# Patient Record
Sex: Male | Born: 1955 | Race: White | Hispanic: No | Marital: Married | Smoking: Never smoker
Health system: Southern US, Community
[De-identification: ages and names within clinical notes are randomized; demographics above are authoritative.]

## PROBLEM LIST (undated history)

## (undated) DIAGNOSIS — G4733 Obstructive sleep apnea (adult) (pediatric): Secondary | ICD-10-CM

## (undated) DIAGNOSIS — E119 Type 2 diabetes mellitus without complications: Secondary | ICD-10-CM

## (undated) DIAGNOSIS — E7800 Pure hypercholesterolemia, unspecified: Secondary | ICD-10-CM

## (undated) DIAGNOSIS — I509 Heart failure, unspecified: Secondary | ICD-10-CM

## (undated) HISTORY — DX: Obstructive sleep apnea (adult) (pediatric): G47.33

## (undated) HISTORY — PX: SINUS SURGERY WITH INSTATRAK: SHX5215

---

## 2017-06-15 ENCOUNTER — Emergency Department (HOSPITAL_COMMUNITY)
Admission: EM | Admit: 2017-06-15 | Discharge: 2017-06-16 | Disposition: A | Discharge status: Home / Self Care | Payer: Commercial Managed Care - PPO | Attending: Emergency Medicine | Admitting: Emergency Medicine

## 2017-06-15 ENCOUNTER — Encounter (HOSPITAL_COMMUNITY): Payer: Self-pay | Admitting: Emergency Medicine

## 2017-06-15 DIAGNOSIS — E119 Type 2 diabetes mellitus without complications: Secondary | ICD-10-CM | POA: Diagnosis not present

## 2017-06-15 DIAGNOSIS — R42 Dizziness and giddiness: Secondary | ICD-10-CM | POA: Diagnosis not present

## 2017-06-15 HISTORY — DX: Pure hypercholesterolemia, unspecified: E78.00

## 2017-06-15 HISTORY — DX: Type 2 diabetes mellitus without complications: E11.9

## 2017-06-15 LAB — COMPREHENSIVE METABOLIC PANEL
ALT: 23 U/L (ref 17–63)
AST: 28 U/L (ref 15–41)
Albumin: 4.1 g/dL (ref 3.5–5.0)
Alkaline Phosphatase: 51 U/L (ref 38–126)
Anion gap: 10 (ref 5–15)
BUN: 12 mg/dL (ref 6–20)
CO2: 24 mmol/L (ref 22–32)
Calcium: 8.5 mg/dL — ABNORMAL LOW (ref 8.9–10.3)
Chloride: 106 mmol/L (ref 101–111)
Creatinine, Ser: 0.89 mg/dL (ref 0.61–1.24)
GFR calc Af Amer: >60 mL/min (ref >60)
GFR calc non Af Amer: >60 mL/min (ref >60)
Glucose, Bld: 220 mg/dL — ABNORMAL HIGH (ref 65–99)
Potassium: 3.7 mmol/L (ref 3.5–5.1)
Sodium: 140 mmol/L (ref 135–145)
Total Bilirubin: 0.4 mg/dL (ref 0.3–1.2)
Total Protein: 7.1 g/dL (ref 6.5–8.1)

## 2017-06-15 LAB — CBC
HCT: 42.6 % (ref 39.0–52.0)
Hemoglobin: 14.3 g/dL (ref 13.0–17.0)
MCH: 29.8 pg (ref 26.0–34.0)
MCHC: 33.6 g/dL (ref 30.0–36.0)
MCV: 88.8 fL (ref 78.0–100.0)
Platelets: 194 10*3/uL (ref 150–400)
RBC: 4.8 MIL/uL (ref 4.22–5.81)
RDW: 13.5 % (ref 11.5–15.5)
WBC: 13.3 10*3/uL — ABNORMAL HIGH (ref 4.0–10.5)

## 2017-06-15 LAB — URINALYSIS, ROUTINE W REFLEX MICROSCOPIC
Bilirubin Urine: NEGATIVE
Glucose, UA: 150 mg/dL — AB
Hgb urine dipstick: NEGATIVE
Ketones, ur: NEGATIVE mg/dL
Leukocytes, UA: NEGATIVE
Nitrite: NEGATIVE
Protein, ur: NEGATIVE mg/dL
Specific Gravity, Urine: 1.024 (ref 1.005–1.030)
pH: 7 (ref 5.0–8.0)

## 2017-06-15 LAB — LIPASE, BLOOD: Lipase: 21 U/L (ref 11–51)

## 2017-06-15 LAB — CBG MONITORING, ED: Glucose-Capillary: 157 mg/dL — ABNORMAL HIGH (ref 65–99)

## 2017-06-15 MED ORDER — ONDANSETRON 4 MG PO TBDP
4.0000 mg | ORAL_TABLET | Freq: Once | ORAL | Status: AC | PRN
  Administered 2017-06-15: 4 mg via ORAL

## 2017-06-15 MED ORDER — MECLIZINE HCL 25 MG PO TABS: 25.0000 mg | ORAL_TABLET | Freq: Three times a day (TID) | ORAL | 0 refills | Status: DC | PRN

## 2017-06-15 MED ORDER — MECLIZINE HCL 25 MG PO TABS
50.0000 mg | ORAL_TABLET | Freq: Once | ORAL | Status: AC
  Administered 2017-06-15: 50 mg via ORAL
  Filled 2017-06-15: qty 2

## 2017-06-15 MED ORDER — ONDANSETRON 4 MG PO TBDP
4.0000 mg | ORAL_TABLET | Freq: Once | ORAL | Status: AC
  Administered 2017-06-15: 4 mg via ORAL
  Filled 2017-06-15: qty 1

## 2017-06-15 MED ORDER — AZITHROMYCIN 250 MG PO TABS
250.0000 mg | ORAL_TABLET | Freq: Every day | ORAL | 0 refills | Status: DC
Start: 2017-06-15 — End: 2018-07-09

## 2017-06-15 NOTE — ED Provider Notes (Signed)
[facility] EMERGENCY DEPARTMENT Provider Note   CSN: [id number] Arrival date & time: 06/15/17  1641     History   Chief Complaint Chief Complaint  Patient presents with  . Dizziness  . Nausea  . Emesis    HPI Marcus Vargas is a 61 y.o. male with history of diabetes and high cholesterol presents to ED for evaluation of sudden onset dizziness described as "room spinning" that began this morning when he sat up in bed. Associated symptoms include nausea and nonbilious, non-bloody emesis. Reports recent nasal congestion, green, thick nasal drainage worse on the right associated with mild sinus/frontal headache. Reports long history of sinus problems with previous sinus surgeries. Has been using bulb syringe to drain nose over the last couple days. Denies fevers, chills, visual changes, tinnitus, chest pain, shortness of breath, abdominal pain, numbness or weakness to extremities. No previous history of CVA/TIA/vertigo.  HPI  Past Medical History:  Diagnosis Date  . Diabetes mellitus without complication (HCC)   . High cholesterol     There are no active problems to display for this patient.   Past Surgical History:  Procedure Laterality Date  . SINUS SURGERY WITH INSTATRAK         Home Medications    Prior to Admission medications   Medication Sig Start Date End Date Taking? Authorizing Provider  azithromycin (ZITHROMAX) 250 MG tablet Take 1 tablet (250 mg total) by mouth daily. Take first 2 tablets together, then 1 every day until finished. 06/15/17   Liberty Handy, PA-C  meclizine (ANTIVERT) 25 MG tablet Take 1 tablet (25 mg total) by mouth 3 (three) times daily as needed for dizziness. 06/15/17   Liberty Handy, PA-C    Family History No family history on file.  Social History Social History  Substance Use Topics  . Smoking status: Never Smoker  . Smokeless tobacco: Never Used  . Alcohol use No     Allergies   Patient has no known  allergies.   Review of Systems Review of Systems  Constitutional: Negative for chills and fever.  HENT: Positive for postnasal drip, rhinorrhea, sinus pain and sinus pressure. Negative for ear discharge.   Eyes: Negative for visual disturbance.  Respiratory: Negative for shortness of breath.   Cardiovascular: Negative for chest pain and palpitations.  Gastrointestinal: Positive for nausea and vomiting. Negative for abdominal pain, constipation and diarrhea.  Genitourinary: Negative for difficulty urinating and dysuria.  Musculoskeletal: Negative for neck pain and neck stiffness.  Skin: Negative for rash.  Neurological: Positive for dizziness and headaches. Negative for syncope, weakness, light-headedness and numbness.     Physical Exam Updated Vital Signs BP (!) 146/90   Pulse (!) 106   Temp 97.7 F (36.5 C) (Oral)   Resp 17   SpO2 94%   Physical Exam  Constitutional: He is oriented to person, place, and time. He appears well-developed and well-nourished. No distress.  NAD.  HENT:  Head: Normocephalic and atraumatic.  Right Ear: External ear normal.  Left Ear: External ear normal.  Nose: Nose normal.  TMs normal bilateral Erythematous mucosal edema, dried up blood in septum bilaterally No facial or sinus tenderness  Eyes: Pupils are equal, round, and reactive to light. Conjunctivae are normal. No scleral icterus. Right eye exhibits nystagmus. Left eye exhibits nystagmus.  PERRL bilaterally Left horizontal nystagmus   Neck: Normal range of motion. Neck supple.  Cardiovascular: Normal rate, regular rhythm, normal heart sounds and intact distal pulses.   No  murmur heard. Pulmonary/Chest: Effort normal and breath sounds normal. He has no wheezes.  Abdominal: Soft. Bowel sounds are normal. There is no tenderness.  Musculoskeletal: Normal range of motion. He exhibits no deformity.  Neurological: He is alert and oriented to person, place, and time.  Speech and phonation  normal.  Strength 5/5 with hand grip and ankle flexion/extension.   Sensation to light touch intact in hands and feet. Gait normal.   Negative Romberg. No leg drift.  Intact finger to nose test. CN I not tested CN II full visual fields bilaterally CN III, IV, VI PEERL and EOMs intact bilaterally CN V light touch intact in all 3 divisions of trigeminal nerve CN VII facial nerve movements intact, symmetric, bilaterally CN VIII hearing intact to finger rub, bilaterally CN IX, X no uvula deviation, symmetric soft palate rise CN XI 5/5 SCM and trapezius strength bilaterally  CN XII Tongue midline with symmetric L/R movement  Skin: Skin is warm and dry. Capillary refill takes less than 2 seconds.  Psychiatric: He has a normal mood and affect. His behavior is normal. Judgment and thought content normal.  Nursing note and vitals reviewed.    ED Treatments / Results  Labs (all labs ordered are listed, but only abnormal results are displayed) Labs Reviewed  COMPREHENSIVE METABOLIC PANEL - Abnormal; Notable for the following:       Result Value   Glucose, Bld 220 (*)    Calcium 8.5 (*)    All other components within normal limits  CBC - Abnormal; Notable for the following:    WBC 13.3 (*)    All other components within normal limits  URINALYSIS, ROUTINE W REFLEX MICROSCOPIC - Abnormal; Notable for the following:    APPearance HAZY (*)    Glucose, UA 150 (*)    All other components within normal limits  CBG MONITORING, ED - Abnormal; Notable for the following:    Glucose-Capillary 157 (*)    All other components within normal limits  LIPASE, BLOOD    EKG  EKG Interpretation None       Radiology No results found.  Procedures Procedures (including critical care time)  Medications Ordered in ED Medications  ondansetron (ZOFRAN-ODT) disintegrating tablet 4 mg (4 mg Oral Given 06/15/17 1942)  meclizine (ANTIVERT) tablet 50 mg (50 mg Oral Given 06/15/17 2144)  ondansetron  (ZOFRAN-ODT) disintegrating tablet 4 mg (4 mg Oral Given 06/15/17 2142)     Initial Impression / Assessment and Plan / ED Course  I have reviewed the triage vital signs and the nursing notes.  Pertinent labs & imaging results that were available during my care of the patient were reviewed by me and considered in my medical decision making (see chart for details).  Clinical Course as of Jun 15 2304  Sat Jun 15, 2017  2118 WBC: (!) 13.3 [CG]    Clinical Course User Index [CG] Liberty Handy, PA-C   61 y.o. yo male presents with intermittent episodes of vertiginous type dizziness. No red flag symptoms including trauma, focal sensory deficits, headache, LOC, shortness of breath, chest pain, palpitations.  No known B12 deficiencies or thyroid conditions.  No new medications or medication changes.  Medications reviewed, none have adverse effects that could be contributing. Reports recent sinus congestion, has been doing bulb syringe rinses.  On exam, neuro intact, HiNTS with horizontal nystagmus otherwise normal. No diplopia, dysarthria, dysphagia, dysmetria, weakness or sensory loss to extremities.  I personally stood patient who was able to stand  w/o assist. I did not appreciate any tremors or shakiness while walking. ENT exam normal.  VS reassuring.  No hyponatremia or other electrolyte abnormalities. EKG without arrythmias/ischemia.  Final Clinical Impressions(s) / ED Diagnoses   Pt given meclizine and zofran. Re-evaluated pt and he reports significant improvement. Patient is considered safe to discharge at this time.  Doubt central cause of symptoms including stroke or TIA.  hx and exam most c/w vertigo. Will discharge with close PCP f/u, strict ED return precautions.  Patient aware of red flag symptoms to monitor for that would warrant return to ED.   Final diagnoses:  Dizziness    New Prescriptions New Prescriptions   AZITHROMYCIN (ZITHROMAX) 250 MG TABLET    Take 1 tablet (250 mg  total) by mouth daily. Take first 2 tablets together, then 1 every day until finished.   MECLIZINE (ANTIVERT) 25 MG TABLET    Take 1 tablet (25 mg total) by mouth 3 (three) times daily as needed for dizziness.     Liberty HandyGibbons, Claudia J, PA-C 06/15/17 2305    Donnetta Hutchingook, Brian, MD 06/15/17 [phone]

## 2017-06-15 NOTE — Discharge Instructions (Signed)
You presented to ED for dizziness, nausea, vomiting. Your lab work and EKG were normal. Your symptoms improved after vertigo and nausea medicine.   We suspect this may be vertigo from a sinus infection.   Take azithromycin for sinusitis. Use meclizine for vertigo three times daily for the next 3-5 days.   Return to ED if you develop worsening dizziness, sudden headache, changes in vision, numbness or weakness to one side of the body, slurred speech

## 2017-06-15 NOTE — ED Notes (Signed)
Message received that patient is having nausea.  Medicated per protocol order.

## 2017-06-15 NOTE — ED Triage Notes (Signed)
Pt sent from [facility] with c/o cold sweats, dizziness, nausea and vomiting onset today. Pt had similar episode 2 weeks ago. [facility].

## 2018-07-09 ENCOUNTER — Ambulatory Visit: Payer: Commercial Managed Care - PPO | Admitting: Cardiovascular Disease

## 2018-07-09 ENCOUNTER — Encounter: Payer: Self-pay | Admitting: Cardiovascular Disease

## 2018-07-09 VITALS — BP 142/86 | HR 91 | Ht 68.0 in | Wt 168.0 lb

## 2018-07-09 DIAGNOSIS — R0989 Other specified symptoms and signs involving the circulatory and respiratory systems: Secondary | ICD-10-CM

## 2018-07-09 DIAGNOSIS — E785 Hyperlipidemia, unspecified: Secondary | ICD-10-CM | POA: Insufficient documentation

## 2018-07-09 DIAGNOSIS — R9431 Abnormal electrocardiogram [ECG] [EKG]: Secondary | ICD-10-CM

## 2018-07-09 DIAGNOSIS — I10 Essential (primary) hypertension: Secondary | ICD-10-CM | POA: Insufficient documentation

## 2018-07-09 NOTE — Assessment & Plan Note (Signed)
EKG shows lateral T wave inversion consistent with either ischemia or left ventricular hypertrophy.

## 2018-07-09 NOTE — Progress Notes (Signed)
07/09/2018 [name]   Dec 20, 1955  [id number]  Primary Physician Daisy Floro, MD Primary Cardiologist: Runell Gess MD Nicholes Calamity, [place]  HPI:  Marcus Vargas is a 62 y.o. thin appearing married Caucasian male father of 4 children, grandfather one grandchild referred by Dr. Tenny Craw for cardiovascular evaluation because of elevated proBNP.  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  His cardiac risk factors are notable for true hyperlipidemia and diabetes.  There is no family history of heart disease.  Never had a heart attack or stroke.  He denies chest pain or shortness of breath.  He does get diaphoretic at times when he is exercising.  Apparently for insurance physical blood work revealed an elevated proBNP and he was referred here for further evaluation.   Current Meds  Medication Sig  . Azelastine HCl 0.15 % SOLN   . Choline Fenofibrate (FENOFIBRIC ACID) 135 MG CPDR   . glimepiride (AMARYL) 1 MG tablet   . levocetirizine (XYZAL) 5 MG tablet   . metFORMIN (GLUCOPHAGE-XR) 500 MG 24 hr tablet   . mometasone (NASONEX) 50 MCG/ACT nasal spray Place into the nose.  . simvastatin (ZOCOR) 20 MG tablet Take 20 mg by mouth.      No Known Allergies  Social History   Socioeconomic History  . Marital status: Married    Spouse name: Not on file  . Number of children: Not on file  . Years of education: Not on file  . Highest education level: Not on file  Occupational History  . Not on file  Social Needs  . Financial resource strain: Not on file  . Food insecurity:    Worry: Not on file    Inability: Not on file  . Transportation needs:    Medical: Not on file    Non-medical: Not on file  Tobacco Use  . Smoking status: Never Smoker  . Smokeless tobacco: Never Used  Substance and Sexual Activity  . Alcohol use: No  . Drug use: No  . Sexual activity: Not on file  Lifestyle  . Physical activity:    Days per week: Not on file    Minutes per session:  Not on file  . Stress: Not on file  Relationships  . Social connections:    Talks on phone: Not on file    Gets together: Not on file    Attends religious service: Not on file    Active member of club or organization: Not on file    Attends meetings of clubs or organizations: Not on file    Relationship status: Not on file  . Intimate partner violence:    Fear of current or ex partner: Not on file    Emotionally abused: Not on file    Physically abused: Not on file    Forced sexual activity: Not on file  Other Topics Concern  . Not on file  Social History Narrative  . Not on file     Review of Systems: General: negative for chills, fever, night sweats or weight changes.  Cardiovascular: negative for chest pain, dyspnea on exertion, edema, orthopnea, palpitations, paroxysmal nocturnal dyspnea or shortness of breath Dermatological: negative for rash Respiratory: negative for cough or wheezing Urologic: negative for hematuria Abdominal: negative for nausea, vomiting, diarrhea, bright red blood per rectum, melena, or hematemesis Neurologic: negative for visual changes, syncope, or dizziness All other systems reviewed and are otherwise negative except as noted above.    Blood  pressure (!) 142/86, pulse 91, height 5\' 8"  (1.727 m), weight 168 lb (76.2 kg).  General appearance: alert and no distress Neck: no adenopathy, no JVD, supple, symmetrical, trachea midline, thyroid not enlarged, symmetric, no tenderness/mass/nodules and Soft bilateral carotid bruits Lungs: clear to auscultation bilaterally Heart: regular rate and rhythm, S1, S2 normal, no murmur, click, rub or gallop Extremities: extremities normal, atraumatic, no cyanosis or edema Pulses: 2+ and symmetric Skin: Skin color, texture, turgor normal. No rashes or lesions Neurologic: Alert and oriented X 3, normal strength and tone. Normal symmetric reflexes. Normal coordination and gait  EKG sinus rhythm 88 with lateral T wave  inversion.  I personally reviewed this EKG.  ASSESSMENT AND PLAN:   Hyperlipidemia History of hyperlipidemia on statin therapy with recent lipid profile performed 01/31/2018 revealing total cholesterol of 146, LDL 67 and HDL of 62.  Abnormal EKG EKG shows lateral T wave inversion consistent with either ischemia or left ventricular hypertrophy.      Runell GessJonathan J. Berry MD FACP,FACC,FAHA, [facility] 07/09/2018 3:56 PM

## 2018-07-09 NOTE — Patient Instructions (Signed)
Medication Instructions:  Your physician recommends that you continue on your current medications as directed. Please refer to the Current Medication list given to you today.  If you need a refill on your cardiac medications before your next appointment, please call your pharmacy.   Lab work: Your physician recommends that you return for lab work in: TODAY - BNP  If you have labs (blood work) drawn today and your tests are completely normal, you will receive your results only by: Marland Kitchen. MyChart Message (if you have MyChart) OR . A paper copy in the mail If you have any lab test that is abnormal or we need to change your treatment, we will call you to review the results.  Testing/Procedures: Your provider has recommended that you have a CT Calcium Score to identify your risk for cardiovascular disease.   Your physician has requested that you have an echocardiogram. Echocardiography is a painless test that uses sound waves to create images of your heart. It provides your doctor with information about the size and shape of your heart and how well your heart's chambers and valves are working. This procedure takes approximately one hour. There are no restrictions for this procedure.  Your physician has requested that you have a carotid duplex. This test is an ultrasound of the carotid arteries in your neck. It looks at blood flow through these arteries that supply the brain with blood. Allow one hour for this exam. There are no restrictions or special instructions.    Follow-Up: At [facility], you and your health needs are our priority.  As part of our continuing mission to provide you with exceptional heart care, we have created designated Provider Care Teams.  These Care Teams include your primary Cardiologist (physician) and Advanced Practice Providers (APPs -  Physician Assistants and Nurse Practitioners) who all work together to provide you with the care you need, when you need it. . Follow up  with Dr. Allyson SabalBerry as needed. .   Any Other Special Instructions Will Be Listed Below (If Applicable).

## 2018-07-09 NOTE — Assessment & Plan Note (Signed)
History of hyperlipidemia on statin therapy with recent lipid profile performed 01/31/2018 revealing total cholesterol of 146, LDL 67 and HDL of 62.

## 2018-07-10 ENCOUNTER — Encounter: Payer: Self-pay | Admitting: Cardiovascular Disease

## 2018-07-10 LAB — BRAIN NATRIURETIC PEPTIDE: BNP: 78.4 pg/mL (ref 0.0–100.0)

## 2018-07-11 ENCOUNTER — Telehealth: Payer: Self-pay | Admitting: Adult Health

## 2018-07-11 NOTE — Telephone Encounter (Signed)
Called patient, gave normal results. Instructed patient how to do [place], sent email to his address to get into his mychart. Patient requested to have it released to there. If you can release them, I don't have the original lab to release. Thank you!

## 2018-07-11 NOTE — Telephone Encounter (Signed)
Follow Up: ° ° ° °Returning Debra's call from today, concerning his lab results. °

## 2018-07-22 ENCOUNTER — Ambulatory Visit (HOSPITAL_COMMUNITY)
Admission: RE | Admit: 2018-07-22 | Discharge: 2018-07-22 | Disposition: A | Discharge status: Home / Self Care | Payer: Commercial Managed Care - PPO | Source: Ambulatory Visit | Attending: Cardiovascular Disease | Admitting: Cardiovascular Disease

## 2018-07-22 DIAGNOSIS — R0989 Other specified symptoms and signs involving the circulatory and respiratory systems: Secondary | ICD-10-CM | POA: Insufficient documentation

## 2018-07-30 ENCOUNTER — Ambulatory Visit (HOSPITAL_COMMUNITY): Payer: Commercial Managed Care - PPO | Attending: Cardiology

## 2018-07-30 ENCOUNTER — Other Ambulatory Visit: Payer: Self-pay

## 2018-07-30 ENCOUNTER — Ambulatory Visit (INDEPENDENT_AMBULATORY_CARE_PROVIDER_SITE_OTHER)
Admission: RE | Admit: 2018-07-30 | Discharge: 2018-07-30 | Disposition: A | Discharge status: Home / Self Care | Payer: Self-pay | Source: Ambulatory Visit | Attending: Cardiovascular Disease | Admitting: Cardiovascular Disease

## 2018-07-30 DIAGNOSIS — R9431 Abnormal electrocardiogram [ECG] [EKG]: Secondary | ICD-10-CM

## 2018-08-04 ENCOUNTER — Other Ambulatory Visit: Payer: Self-pay | Admitting: *Deleted

## 2018-08-04 DIAGNOSIS — R0602 Shortness of breath: Secondary | ICD-10-CM

## 2018-08-04 DIAGNOSIS — R931 Abnormal findings on diagnostic imaging of heart and coronary circulation: Secondary | ICD-10-CM

## 2018-08-06 ENCOUNTER — Telehealth (HOSPITAL_COMMUNITY): Payer: Self-pay

## 2018-08-06 LAB — PRO B NATRIURETIC PEPTIDE: NT-Pro BNP: 400 pg/mL — ABNORMAL HIGH (ref 0–210)

## 2018-08-06 NOTE — Telephone Encounter (Signed)
Encounter complete. 

## 2018-08-07 ENCOUNTER — Ambulatory Visit (HOSPITAL_COMMUNITY)
Admission: RE | Admit: 2018-08-07 | Discharge: 2018-08-07 | Disposition: A | Discharge status: Home / Self Care | Payer: Commercial Managed Care - PPO | Source: Ambulatory Visit | Attending: Cardiology | Admitting: Cardiology

## 2018-08-07 DIAGNOSIS — R931 Abnormal findings on diagnostic imaging of heart and coronary circulation: Secondary | ICD-10-CM | POA: Insufficient documentation

## 2018-08-07 LAB — MYOCARDIAL PERFUSION IMAGING
LV dias vol: 222 mL (ref 62–150)
LV sys vol: 159 mL
Peak HR: 112 {beats}/min
Rest HR: 83 {beats}/min
SDS: 2
SRS: 3
SSS: 5
TID: 1.12

## 2018-08-07 MED ORDER — REGADENOSON 0.4 MG/5ML IV SOLN
0.4000 mg | Freq: Once | INTRAVENOUS | Status: AC
  Administered 2018-08-07: 0.4 mg via INTRAVENOUS

## 2018-08-07 MED ORDER — TECHNETIUM TC 99M TETROFOSMIN IV KIT
10.4000 | PACK | Freq: Once | INTRAVENOUS | Status: AC | PRN
  Administered 2018-08-07: 10.4 via INTRAVENOUS
  Filled 2018-08-07: qty 11

## 2018-08-07 MED ORDER — TECHNETIUM TC 99M TETROFOSMIN IV KIT
32.4000 | PACK | Freq: Once | INTRAVENOUS | Status: AC | PRN
  Administered 2018-08-07: 32.4 via INTRAVENOUS
  Filled 2018-08-07: qty 33

## 2018-08-08 ENCOUNTER — Encounter: Payer: Self-pay | Admitting: Cardiovascular Disease

## 2018-08-08 ENCOUNTER — Ambulatory Visit: Payer: Commercial Managed Care - PPO | Admitting: Cardiovascular Disease

## 2018-08-08 DIAGNOSIS — I519 Heart disease, unspecified: Secondary | ICD-10-CM

## 2018-08-08 NOTE — Patient Instructions (Signed)
    [facility] [address] [place] [address] Dept: [phone] Loc: [phone]  Steward DroneBryant Vickrey  08/08/2018  You are scheduled for a Cardiac Catheterization on Monday, January 6 with Dr. Nanetta BattyJonathan Berry.  1. [facility]: [address] [place], [place] [address] at 11:30 AM (This time is two hours before your procedure to ensure your preparation). Free valet parking service is available.   Special note: Every effort is made to have your procedure done on time. Please understand that emergencies sometimes delay scheduled procedures.  2. Diet: Do not eat solid foods after midnight.  The patient may have clear liquids until 5am upon the day of the procedure.  3. Labs: You will need to have blood drawn on TODAY  4. Medication instructions in preparation for your procedure:  START ASPIRIN 81 MG ONCE DAILY  DO NOT TAKE METFORMIN 08/25/18 OR 08/26/18 OR 08/27/18=REASTART 08/28/18  On the morning of your procedure, take your Aspirin and any morning medicines NOT listed above.  You may use sips of water.  5. Plan for one night stay--bring personal belongings. 6. Bring a current list of your medications and current insurance cards. 7. You MUST have a responsible person to drive you home. 8. Someone MUST be with you the first 24 hours after you arrive home or your discharge will be delayed. 9. Please wear clothes that are easy to get on and off and wear slip-on shoes.  Thank you for allowing us to care for you!   --  Invasive Cardiovascular services

## 2018-08-08 NOTE — Progress Notes (Signed)
Marcus Vargas returns today for follow-up of his outpatient test.  He had a 2D echo that showed severe LV dysfunction with an EF in the 30 to 35% range with mild to moderate aortic insufficiency.  He had a Myoview stress test performed 08/07/2018 that did show findings concerning for inferior ischemia and a coronary CTA that showed a high coronary calcium score of 1094 with calcification of left main and all 3 coronary arteries. This, we decided to proceed with outpatient radial diagnostic coronary angiography to define his anatomy. The patient understands that risks included but are not limited to stroke (1 in 1000), death (1 in 1000), kidney failure [usually temporary] (1 in 500), bleeding (1 in 200), allergic reaction [possibly serious] (1 in 200). The patient understands and agrees to proceed  Runell GessJonathan J. Berry, M.D., FACP, [facility]FACC, Kathryne ErikssonFAHA, FSCAI [facility] [address] [place], [place]  [address]  [phone] 08/08/2018 3:49 PM

## 2018-08-08 NOTE — Assessment & Plan Note (Signed)
Marcus Vargas returns today for follow-up of his outpatient test.  He had a 2D echo that showed severe LV dysfunction with an EF in the 30 to 35% range with mild to moderate aortic insufficiency.  He had a Myoview stress test performed 08/07/2018 that did show findings concerning for inferior ischemia and a coronary CTA that showed a high coronary calcium score of 1094 with calcification of left main and all 3 coronary arteries. This, we decided to proceed with outpatient radial diagnostic coronary angiography to define his anatomy. The patient understands that risks included but are not limited to stroke (1 in 1000), death (1 in 1000), kidney failure [usually temporary] (1 in 500), bleeding (1 in 200), allergic reaction [possibly serious] (1 in 200). The patient understands and agrees to proceed

## 2018-08-08 NOTE — Addendum Note (Signed)
Addended by: Freddi StarrMATHIS, DEBRA W on: 08/08/2018 04:27 PM   Modules accepted: Orders, SmartSet

## 2018-08-09 LAB — BASIC METABOLIC PANEL
BUN/Creatinine Ratio: 22 (ref 10–24)
BUN: 18 mg/dL (ref 8–27)
CO2: 26 mmol/L (ref 20–29)
Calcium: 9.9 mg/dL (ref 8.6–10.2)
Chloride: 106 mmol/L (ref 96–106)
Creatinine, Ser: 0.83 mg/dL (ref 0.76–1.27)
GFR calc Af Amer: 109 mL/min/{1.73_m2} (ref >59)
GFR calc non Af Amer: 94 mL/min/{1.73_m2} (ref >59)
Glucose: 105 mg/dL — ABNORMAL HIGH (ref 65–99)
Potassium: 3.8 mmol/L (ref 3.5–5.2)
Sodium: 147 mmol/L — ABNORMAL HIGH (ref 134–144)

## 2018-08-09 LAB — CBC
Hematocrit: 42.1 % (ref 37.5–51.0)
Hemoglobin: 14.2 g/dL (ref 13.0–17.7)
MCH: 29.3 pg (ref 26.6–33.0)
MCHC: 33.7 g/dL (ref 31.5–35.7)
MCV: 87 fL (ref 79–97)
Platelets: 242 10*3/uL (ref 150–450)
RBC: 4.84 x10E6/uL (ref 4.14–5.80)
RDW: 13.1 % (ref 12.3–15.4)
WBC: 6.8 10*3/uL (ref 3.4–10.8)

## 2018-08-12 ENCOUNTER — Ambulatory Visit: Payer: Commercial Managed Care - PPO | Admitting: Cardiovascular Disease

## 2018-08-15 ENCOUNTER — Telehealth: Payer: Self-pay

## 2018-08-15 NOTE — Telephone Encounter (Signed)
Informed pt of change in scheduled procedure date and time for left heart cath and coronary angiography; pt made aware that new procedure date is 08/28/2018 and new procedure time is 11:00AM; pt verbalized understanding

## 2018-08-18 ENCOUNTER — Telehealth: Payer: Self-pay

## 2018-08-18 NOTE — Telephone Encounter (Signed)
Called to remind pt of new date of upcoming procedure: 08/28/2017. [facility] at [facility] for procedure at 1130am; pt verbalized understanding

## 2018-08-22 ENCOUNTER — Ambulatory Visit: Payer: Commercial Managed Care - PPO | Admitting: Cardiovascular Disease

## 2018-08-26 ENCOUNTER — Telehealth: Payer: Self-pay | Admitting: *Deleted

## 2018-08-26 NOTE — Telephone Encounter (Signed)
Pt contacted pre-catheterization scheduled at [facility] for: Thursday August 28, 2018 11:30 AM Verified arrival time and place: [facility] Main Entrance A at: 9:30 AM  No solid food after midnight prior to cath, clear liquids until 5 AM day of procedure. Contrast allergy: no  Hold: Metformin-day of procedure and 48 post procedure. Glimepiride-AM of procedure.  Except hold medications AM meds can be  taken pre-cath with sip of water including: ASA 81 mg  Confirmed patient has responsible person to drive home post procedure and for 24 hours after you arrive home: yes

## 2018-08-27 NOTE — H&P (Signed)
08/27/18 [name]   1956/07/19  [id number]  Primary Physician Daisy Floro, MD Primary Cardiologist: Runell Gess MD Nicholes Calamity, [place]  HPI:  Marcus Vargas is a 63 y.o. thin appearing married Caucasian male father of 4 children, grandfather one grandchild referred by Dr. Tenny Craw for cardiovascular evaluation because of elevated proBNP.  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  His cardiac risk factors are notable for true hyperlipidemia and diabetes.  There is no family history of heart disease.  Never had a heart attack or stroke.  He denies chest pain or shortness of breath.  He does get diaphoretic at times when he is exercising.  Apparently for insurance physical blood work revealed an elevated proBNP and he was referred here for further evaluation.   ActiveMedications      Current Meds  Medication Sig  . Azelastine HCl 0.15 % SOLN   . Choline Fenofibrate (FENOFIBRIC ACID) 135 MG CPDR   . glimepiride (AMARYL) 1 MG tablet   . levocetirizine (XYZAL) 5 MG tablet   . metFORMIN (GLUCOPHAGE-XR) 500 MG 24 hr tablet   . mometasone (NASONEX) 50 MCG/ACT nasal spray Place into the nose.  . simvastatin (ZOCOR) 20 MG tablet Take 20 mg by mouth.        No Known Allergies  Social History        Socioeconomic History  . Marital status: Married    Spouse name: Not on file  . Number of children: Not on file  . Years of education: Not on file  . Highest education level: Not on file  Occupational History  . Not on file  Social Needs  . Financial resource strain: Not on file  . Food insecurity:    Worry: Not on file    Inability: Not on file  . Transportation needs:    Medical: Not on file    Non-medical: Not on file  Tobacco Use  . Smoking status: Never Smoker  . Smokeless tobacco: Never Used  Substance and Sexual Activity  . Alcohol use: No  . Drug use: No  . Sexual activity: Not on file  Lifestyle  . Physical  activity:    Days per week: Not on file    Minutes per session: Not on file  . Stress: Not on file  Relationships  . Social connections:    Talks on phone: Not on file    Gets together: Not on file    Attends religious service: Not on file    Active member of club or organization: Not on file    Attends meetings of clubs or organizations: Not on file    Relationship status: Not on file  . Intimate partner violence:    Fear of current or ex partner: Not on file    Emotionally abused: Not on file    Physically abused: Not on file    Forced sexual activity: Not on file  Other Topics Concern  . Not on file  Social History Narrative  . Not on file     Review of Systems: General: negative for chills, fever, night sweats or weight changes.  Cardiovascular: negative for chest pain, dyspnea on exertion, edema, orthopnea, palpitations, paroxysmal nocturnal dyspnea or shortness of breath Dermatological: negative for rash Respiratory: negative for cough or wheezing Urologic: negative for hematuria Abdominal: negative for nausea, vomiting, diarrhea, bright red blood per rectum, melena, or hematemesis Neurologic: negative for visual changes, syncope, or dizziness All other systems  reviewed and are otherwise negative except as noted above.    Blood pressure (!) 142/86, pulse 91, height 5\' 8"  (1.727 m), weight 168 lb (76.2 kg).  General appearance: alert and no distress Neck: no adenopathy, no JVD, supple, symmetrical, trachea midline, thyroid not enlarged, symmetric, no tenderness/mass/nodules and Soft bilateral carotid bruits Lungs: clear to auscultation bilaterally Heart: regular rate and rhythm, S1, S2 normal, no murmur, click, rub or gallop Extremities: extremities normal, atraumatic, no cyanosis or edema Pulses: 2+ and symmetric Skin: Skin color, texture, turgor normal. No rashes or lesions Neurologic: Alert and oriented X 3, normal strength and tone.  Normal symmetric reflexes. Normal coordination and gait  EKG sinus rhythm 88 with lateral T wave inversion.  I personally reviewed this EKG.  ASSESSMENT AND PLAN:   Hyperlipidemia History of hyperlipidemia on statin therapy with recent lipid profile performed 01/31/2018 revealing total cholesterol of 146, LDL 67 and HDL of 62.  Abnormal EKG EKG shows lateral T wave inversion consistent with either ischemia or left ventricular hypertrophy.  Left ventricular dysfunction: Because of severe LV dysfunction and a Myoview that she was suggestive of inferior ischemia he presents for diagnostic coronary angiography via the right radial approach.     Runell GessJonathan J. , M.D., FACP, [facility] VinelandFACC, Earl LagosFAHA, [facility]SCAI [facility] [address] [place], [place]  [id number]  [phone] 08/27/2018 1:02 PM

## 2018-08-28 ENCOUNTER — Other Ambulatory Visit: Payer: Self-pay

## 2018-08-28 ENCOUNTER — Encounter (HOSPITAL_COMMUNITY)
Admission: RE | Disposition: A | Discharge status: Home / Self Care | Payer: Self-pay | Source: Home / Self Care | Attending: Cardiovascular Disease

## 2018-08-28 ENCOUNTER — Ambulatory Visit (HOSPITAL_COMMUNITY)
Admission: RE | Admit: 2018-08-28 | Discharge: 2018-08-28 | Disposition: A | Discharge status: Home / Self Care | Payer: Commercial Managed Care - PPO | Attending: Cardiovascular Disease | Admitting: Cardiovascular Disease

## 2018-08-28 DIAGNOSIS — E785 Hyperlipidemia, unspecified: Secondary | ICD-10-CM | POA: Diagnosis not present

## 2018-08-28 DIAGNOSIS — I420 Dilated cardiomyopathy: Secondary | ICD-10-CM | POA: Insufficient documentation

## 2018-08-28 DIAGNOSIS — Z7984 Long term (current) use of oral hypoglycemic drugs: Secondary | ICD-10-CM | POA: Diagnosis not present

## 2018-08-28 DIAGNOSIS — R9431 Abnormal electrocardiogram [ECG] [EKG]: Secondary | ICD-10-CM | POA: Insufficient documentation

## 2018-08-28 DIAGNOSIS — E119 Type 2 diabetes mellitus without complications: Secondary | ICD-10-CM | POA: Insufficient documentation

## 2018-08-28 DIAGNOSIS — I2510 Atherosclerotic heart disease of native coronary artery without angina pectoris: Secondary | ICD-10-CM | POA: Diagnosis not present

## 2018-08-28 DIAGNOSIS — Z79899 Other long term (current) drug therapy: Secondary | ICD-10-CM | POA: Diagnosis not present

## 2018-08-28 DIAGNOSIS — I519 Heart disease, unspecified: Secondary | ICD-10-CM | POA: Diagnosis not present

## 2018-08-28 HISTORY — PX: LEFT HEART CATH AND CORONARY ANGIOGRAPHY: CATH118249

## 2018-08-28 LAB — GLUCOSE, CAPILLARY: Glucose-Capillary: 114 mg/dL — ABNORMAL HIGH (ref 70–99)

## 2018-08-28 SURGERY — LEFT HEART CATH AND CORONARY ANGIOGRAPHY
Anesthesia: LOCAL

## 2018-08-28 MED ORDER — ASPIRIN 81 MG PO CHEW: 81.0000 mg | CHEWABLE_TABLET | ORAL | Status: DC

## 2018-08-28 MED ORDER — VERAPAMIL HCL 2.5 MG/ML IV SOLN
INTRAVENOUS | Status: AC
  Filled 2018-08-28: qty 2

## 2018-08-28 MED ORDER — HEPARIN (PORCINE) IN NACL 1000-0.9 UT/500ML-% IV SOLN
INTRAVENOUS | Status: AC
  Filled 2018-08-28: qty 1000

## 2018-08-28 MED ORDER — NITROGLYCERIN 1 MG/10 ML FOR IR/CATH LAB
INTRA_ARTERIAL | Status: AC
  Filled 2018-08-28: qty 10

## 2018-08-28 MED ORDER — LIDOCAINE HCL (PF) 1 % IJ SOLN
INTRAMUSCULAR | Status: AC
  Filled 2018-08-28: qty 30

## 2018-08-28 MED ORDER — SODIUM CHLORIDE 0.9 % IV SOLN: 250.0000 mL | INTRAVENOUS | Status: DC | PRN

## 2018-08-28 MED ORDER — SODIUM CHLORIDE 0.9% FLUSH: 3.0000 mL | Freq: Two times a day (BID) | INTRAVENOUS | Status: DC

## 2018-08-28 MED ORDER — HEPARIN (PORCINE) IN NACL 1000-0.9 UT/500ML-% IV SOLN
INTRAVENOUS | Status: DC | PRN
  Administered 2018-08-28 (×2): 500 mL

## 2018-08-28 MED ORDER — SODIUM CHLORIDE 0.9% FLUSH: 3.0000 mL | INTRAVENOUS | Status: DC | PRN

## 2018-08-28 MED ORDER — FENTANYL CITRATE (PF) 100 MCG/2ML IJ SOLN
INTRAMUSCULAR | Status: DC | PRN
  Administered 2018-08-28 (×2): 25 ug via INTRAVENOUS

## 2018-08-28 MED ORDER — HEPARIN SODIUM (PORCINE) 1000 UNIT/ML IJ SOLN
INTRAMUSCULAR | Status: AC
  Filled 2018-08-28: qty 1

## 2018-08-28 MED ORDER — SODIUM CHLORIDE 0.9 % IV SOLN: INTRAVENOUS | Status: AC

## 2018-08-28 MED ORDER — MIDAZOLAM HCL 2 MG/2ML IJ SOLN
INTRAMUSCULAR | Status: AC
  Filled 2018-08-28: qty 2

## 2018-08-28 MED ORDER — HYDRALAZINE HCL 20 MG/ML IJ SOLN
INTRAMUSCULAR | Status: AC
  Filled 2018-08-28: qty 1

## 2018-08-28 MED ORDER — VERAPAMIL HCL 2.5 MG/ML IV SOLN
INTRAVENOUS | Status: DC | PRN
  Administered 2018-08-28: 10 mL via INTRA_ARTERIAL

## 2018-08-28 MED ORDER — FENTANYL CITRATE (PF) 100 MCG/2ML IJ SOLN
INTRAMUSCULAR | Status: AC
  Filled 2018-08-28: qty 2

## 2018-08-28 MED ORDER — MIDAZOLAM HCL 2 MG/2ML IJ SOLN
INTRAMUSCULAR | Status: DC | PRN
  Administered 2018-08-28 (×2): 1 mg via INTRAVENOUS

## 2018-08-28 MED ORDER — MORPHINE SULFATE (PF) 10 MG/ML IV SOLN: 2.0000 mg | INTRAVENOUS | Status: DC | PRN

## 2018-08-28 MED ORDER — LIDOCAINE HCL (PF) 1 % IJ SOLN
INTRAMUSCULAR | Status: DC | PRN
  Administered 2018-08-28: 2 mL via INTRADERMAL

## 2018-08-28 MED ORDER — ONDANSETRON HCL 4 MG/2ML IJ SOLN: 4.0000 mg | Freq: Four times a day (QID) | INTRAMUSCULAR | Status: DC | PRN

## 2018-08-28 MED ORDER — ACETAMINOPHEN 325 MG PO TABS: 650.0000 mg | ORAL_TABLET | ORAL | Status: DC | PRN

## 2018-08-28 MED ORDER — HEPARIN SODIUM (PORCINE) 1000 UNIT/ML IJ SOLN
INTRAMUSCULAR | Status: DC | PRN
  Administered 2018-08-28: 4000 [IU] via INTRAVENOUS

## 2018-08-28 MED ORDER — SODIUM CHLORIDE 0.9 % WEIGHT BASED INFUSION
3.0000 mL/kg/h | INTRAVENOUS | Status: AC
  Administered 2018-08-28: 3 mL/kg/h via INTRAVENOUS

## 2018-08-28 MED ORDER — HYDRALAZINE HCL 20 MG/ML IJ SOLN
INTRAMUSCULAR | Status: DC | PRN
  Administered 2018-08-28: 10 mg via INTRAVENOUS

## 2018-08-28 MED ORDER — SODIUM CHLORIDE 0.9 % WEIGHT BASED INFUSION: 1.0000 mL/kg/h | INTRAVENOUS | Status: DC

## 2018-08-28 SURGICAL SUPPLY — 12 items
CATH INFINITI 5FR ANG PIGTAIL (CATHETERS) ×1 IMPLANT
CATH OPTITORQUE TIG 4.0 5F (CATHETERS) ×1 IMPLANT
DEVICE RAD COMP TR BAND LRG (VASCULAR PRODUCTS) ×1 IMPLANT
GLIDESHEATH SLEND A-KIT 6F 22G (SHEATH) ×1 IMPLANT
GUIDEWIRE INQWIRE 1.5J.035X260 (WIRE) IMPLANT
INQWIRE 1.5J .035X260CM (WIRE) ×4
KIT HEART LEFT (KITS) ×2 IMPLANT
PACK CARDIAC CATHETERIZATION (CUSTOM PROCEDURE TRAY) ×2 IMPLANT
SYR MEDRAD MARK 7 150ML (SYRINGE) ×2 IMPLANT
TRANSDUCER W/STOPCOCK (MISCELLANEOUS) ×2 IMPLANT
TUBING CIL FLEX 10 FLL-RA (TUBING) ×2 IMPLANT
WIRE HI TORQ VERSACORE-J 145CM (WIRE) ×1 IMPLANT

## 2018-08-28 NOTE — Discharge Instructions (Signed)
Drink plenty of fluids over next 48 hours and keep right wrist elevated at heart level for 24 hours Hold Metformin 48 hours. May resume 1/11 pm  Radial Site Care  This sheet gives you information about how to care for yourself after your procedure. Your health care provider may also give you more specific instructions. If you have problems or questions, contact your health care provider. What can I expect after the procedure? After the procedure, it is common to have:  Bruising and tenderness at the catheter insertion area. Follow these instructions at home: Medicines  Take over-the-counter and prescription medicines only as told by your health care provider. Insertion site care  Follow instructions from your health care provider about how to take care of your insertion site. Make sure you: ? Wash your hands with soap and water before you change your bandage (dressing). If soap and water are not available, use hand sanitizer. ? Change your dressing as told by your health care provider. ? Leave stitches (sutures), skin glue, or adhesive strips in place. These skin closures may need to stay in place for 2 weeks or longer. If adhesive strip edges start to loosen and curl up, you may trim the loose edges. Do not remove adhesive strips completely unless your health care provider tells you to do that.  Check your insertion site every day for signs of infection. Check for: ? Redness, swelling, or pain. ? Fluid or blood. ? Pus or a bad smell. ? Warmth.  Do not take baths, swim, or use a hot tub until your health care provider approves.  You may shower 24-48 hours after the procedure, or as directed by your health care provider. ? Remove the dressing and gently wash the site with plain soap and water. ? Pat the area dry with a clean towel. ? Do not rub the site. That could cause bleeding.  Do not apply powder or lotion to the site. Activity   For 24 hours after the procedure, or as  directed by your health care provider: ? Do not flex or bend the affected arm. ? Do not push or pull heavy objects with the affected arm. ? Do not drive yourself home from the hospital or clinic. You may drive 24 hours after the procedure unless your health care provider tells you not to. ? Do not operate machinery or power tools.  Do not lift anything that is heavier than 10 lb (4.5 kg), or the limit that you are told, until your health care provider says that it is safe.  Ask your health care provider when it is okay to: ? Return to work or school. ? Resume usual physical activities or sports. ? Resume sexual activity. General instructions  If the catheter site starts to bleed, raise your arm and put firm pressure on the site. If the bleeding does not stop, get help right away. This is a medical emergency.  If you went home on the same day as your procedure, a responsible adult should be with you for the first 24 hours after you arrive home.  Keep all follow-up visits as told by your health care provider. This is important. Contact a health care provider if:  You have a fever.  You have redness, swelling, or yellow drainage around your insertion site. Get help right away if:  You have unusual pain at the radial site.  The catheter insertion area swells very fast.  The insertion area is bleeding, and the bleeding does  not stop when you hold steady pressure on the area.  Your arm or hand becomes pale, cool, tingly, or numb. These symptoms may represent a serious problem that is an emergency. Do not wait to see if the symptoms will go away. Get medical help right away. Call your local emergency services (911 in the [place] Do not drive yourself to the hospital. Summary  After the procedure, it is common to have bruising and tenderness at the site.  Follow instructions from your health care provider about how to take care of your radial site wound. Check the wound every day for  signs of infection.  Do not lift anything that is heavier than 10 lb (4.5 kg), or the limit that you are told, until your health care provider says that it is safe. This information is not intended to replace advice given to you by your health care provider. Make sure you discuss any questions you have with your health care provider. Document Released: 09/08/2010 Document Revised: 09/11/2017 Document Reviewed: 09/11/2017 Elsevier Interactive Patient Education  2019 [employer]

## 2018-08-28 NOTE — Interval H&P Note (Signed)
Cath Lab Visit (complete for each Cath Lab visit)  Clinical Evaluation Leading to the Procedure:   ACS: No.  Non-ACS:    Anginal Classification: No Symptoms  Anti-ischemic medical therapy: No Therapy  Non-Invasive Test Results: Intermediate-risk stress test findings: cardiac mortality 1-3%/year  Prior CABG: No previous CABG      History and Physical Interval Note:  08/28/2018 11:43 AM  [name]  has presented today for surgery, with the diagnosis of abnormal stress test  The various methods of treatment have been discussed with the patient and family. After consideration of risks, benefits and other options for treatment, the patient has consented to  Procedure(s): LEFT HEART CATH AND CORONARY ANGIOGRAPHY (N/A) as a surgical intervention .  The patient's history has been reviewed, patient examined, no change in status, stable for surgery.  I have reviewed the patient's chart and labs.  Questions were answered to the patient's satisfaction.     Marcus Vargas

## 2018-08-29 ENCOUNTER — Encounter (HOSPITAL_COMMUNITY): Payer: Self-pay | Admitting: Cardiovascular Disease

## 2018-08-29 MED FILL — Nitroglycerin IV Soln 100 MCG/ML in D5W: INTRA_ARTERIAL | Qty: 10 | Status: AC

## 2018-09-19 ENCOUNTER — Ambulatory Visit: Payer: Commercial Managed Care - PPO | Admitting: Cardiovascular Disease

## 2018-09-19 ENCOUNTER — Encounter: Payer: Self-pay | Admitting: Cardiovascular Disease

## 2018-09-19 VITALS — BP 152/88 | HR 97 | Ht 68.0 in | Wt 167.8 lb

## 2018-09-19 DIAGNOSIS — Z01812 Encounter for preprocedural laboratory examination: Secondary | ICD-10-CM

## 2018-09-19 DIAGNOSIS — E782 Mixed hyperlipidemia: Secondary | ICD-10-CM | POA: Diagnosis not present

## 2018-09-19 DIAGNOSIS — I519 Heart disease, unspecified: Secondary | ICD-10-CM | POA: Diagnosis not present

## 2018-09-19 MED ORDER — CARVEDILOL 3.125 MG PO TABS: 3.1250 mg | ORAL_TABLET | Freq: Two times a day (BID) | ORAL | 3 refills | Status: DC

## 2018-09-19 MED ORDER — SACUBITRIL-VALSARTAN 24-26 MG PO TABS: 1.0000 | ORAL_TABLET | Freq: Two times a day (BID) | ORAL | 3 refills | Status: DC

## 2018-09-19 NOTE — Assessment & Plan Note (Signed)
History of hyperlipidemia on statin therapy with lipid profile performed 01/31/2018 revealing total cholesterol 146, LDL 67 and HDL of 62.

## 2018-09-19 NOTE — Progress Notes (Signed)
09/19/2018 Marcus Vargas   Aug 27, 1955  [id number]  Primary Physician Daisy Florooss, Charles Alan, MD Primary Cardiologist: Runell GessJonathan J Berry MD Nicholes CalamityFACP, FACC, FAHA, [place]  HPI:  Marcus Vargas is a 63 y.o.  thin appearing married Caucasian male father of 4 children, grandfather one grandchild referred by Dr. Tenny Crawoss for cardiovascular evaluation because of elevated proBNP.  I last saw him in the office 08/27/2018. 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 His cardiac risk factors are notable for true hyperlipidemia and diabetes. There is no family history of heart disease. Never had a heart attack or stroke. He denies chest pain or shortness of breath. He does get diaphoretic at times when he is exercising. Apparently for insurance physical blood work revealed an elevated proBNP and he was referred here for further evaluation.  A 2D echocardiogram revealed severe LV dysfunction and a Myoview stress test showed subtle inferior ischemia.  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  He is completely asymptomatic. Current Meds  Medication Sig  . Azelastine HCl 0.15 % SOLN Place 2 sprays into both nostrils daily.   Marland Kitchen. CALCIUM CITRATE-VITAMIN D3 PO Take 1 tablet by mouth daily.  . Choline Fenofibrate (FENOFIBRIC ACID) 135 MG CPDR Take 135 mg by mouth daily.   Marland Kitchen. glimepiride (AMARYL) 1 MG tablet Take 1 mg by mouth daily with breakfast.   . levocetirizine (XYZAL) 5 MG tablet Take 5 mg by mouth every evening.   . metFORMIN (GLUCOPHAGE-XR) 500 MG 24 hr tablet Take 500-1,000 mg by mouth See admin instructions. Take 500 mg by mouth in the morning and 1000 mg in the evening  . mometasone (NASONEX) 50 MCG/ACT nasal spray Place 2 sprays into the nose daily.   . Multiple Vitamin (MULTIVITAMIN WITH MINERALS) TABS tablet Take 1 tablet by  mouth daily.  . Omega-3 Fatty Acids (EQL OMEGA 3 FISH OIL) 1400 MG CAPS Take 1,400 mg by mouth daily.  . simvastatin (ZOCOR) 20 MG tablet Take 20 mg by mouth daily.      No Known Allergies  Social History   Socioeconomic History  . Marital status: Married    Spouse name: Not on file  . Number of children: Not on file  . Years of education: Not on file  . Highest education level: Not on file  Occupational History  . Not on file  Social Needs  . Financial resource strain: Not on file  . Food insecurity:    Worry: Not on file    Inability: Not on file  . Transportation needs:    Medical: Not on file    Non-medical: Not on file  Tobacco Use  . Smoking status: Never Smoker  . Smokeless tobacco: Never Used  Substance and Sexual Activity  . Alcohol use: No  . Drug use: No  . Sexual activity: Not on file  Lifestyle  . Physical activity:    Days per week: Not on file    Minutes per session: Not on file  . Stress: Not on file  Relationships  . Social connections:    Talks on phone: Not on file    Gets together: Not on file    Attends religious service: Not on file    Active member of club or organization: Not on file    Attends meetings of clubs or organizations: Not on file  Relationship status: Not on file  . Intimate partner violence:    Fear of current or ex partner: Not on file    Emotionally abused: Not on file    Physically abused: Not on file    Forced sexual activity: Not on file  Other Topics Concern  . Not on file  Social History Narrative  . Not on file     Review of Systems: General: negative for chills, fever, night sweats or weight changes.  Cardiovascular: negative for chest pain, dyspnea on exertion, edema, orthopnea, palpitations, paroxysmal nocturnal dyspnea or shortness of breath Dermatological: negative for rash Respiratory: negative for cough or wheezing Urologic: negative for hematuria Abdominal: negative for nausea, vomiting, diarrhea,  bright red blood per rectum, melena, or hematemesis Neurologic: negative for visual changes, syncope, or dizziness All other systems reviewed and are otherwise negative except as noted above.    Blood pressure (!) 152/88, pulse 97, height 5\' 8"  (1.727 m), weight 167 lb 12.8 oz (76.1 kg).  General appearance: alert and no distress Neck: no adenopathy, no carotid bruit, no JVD, supple, symmetrical, trachea midline and thyroid not enlarged, symmetric, no tenderness/mass/nodules Lungs: clear to auscultation bilaterally Heart: regular rate and rhythm, S1, S2 normal, no murmur, click, rub or gallop Extremities: extremities normal, atraumatic, no cyanosis or edema Pulses: 2+ and symmetric Skin: Skin color, texture, turgor normal. No rashes or lesions Neurologic: Alert and oriented X 3, normal strength and tone. Normal symmetric reflexes. Normal coordination and gait  EKG not performed today  ASSESSMENT AND PLAN:   Hyperlipidemia History of hyperlipidemia on statin therapy with lipid profile performed 01/31/2018 revealing total cholesterol 146, LDL 67 and HDL of 62.  Left ventricular dysfunction History of left ventricular dysfunction with an EF in the 30% range by 2D echo and recent cardiac cath performed by myself 08/28/2018 via the right radial approach revealing moderate complex proximal LAD disease, moderate mid LAD disease and moderate mid RCA disease.  His Myoview stress test by my eye was low risk with subtle inferior ischemia.  Is completely asymptomatic.  I suspect that his CAD is true true and unrelated and that he has a nonischemic cardiomyopathy.  I am going to begin him on low-dose carvedilol and Entresto.  We will check a basic metabolic panel in 7 to 10 days and he will see Kristen back in 2 weeks for medication titration.  I will have him see the advanced heart failure clinic for pharmacologic optimization.  We will get a 2D echo in 3 months and back to see me after that.  If his EF  does not improve he may require insertion of an ICD for primary prevention.      Runell Gess MD FACP,FACC,FAHA, [facility] 09/19/2018 10:55 AM

## 2018-09-19 NOTE — Patient Instructions (Addendum)
Medication Instructions:  Your physician has recommended you make the following change in your medication:   START CARVEDILOL 3.125 MG, ONE TABLET BY MOUTH TWICE DAILY  START ENTRESTO 24/26 MG, ONE TABLET BY MOUTH TWICE DAILY  If you need a refill on your cardiac medications before your next appointment, please call your pharmacy.   Lab work: Your physician recommends that you return for lab work 7-10 DAYS AFTER STARTING YOUR CARVEDILOL AND ENTRESTO (BMET).  If you have labs (blood work) drawn today and your tests are completely normal, you will receive your results only by: Marland Kitchen MyChart Message (if you have MyChart) OR . A paper copy in the mail If you have any lab test that is abnormal or we need to change your treatment, we will call you to review the results.  Testing/Procedures: Your physician has requested that you have an echocardiogram. Echocardiography is a painless test that uses sound waves to create images of your heart. It provides your doctor with information about the size and shape of your heart and how well your heart's chambers and valves are working. This procedure takes approximately one hour. There are no restrictions for this procedure.  SCHEDULE IN 3 MONTHS (April 2020)  Follow-Up: At [facility], you and your health needs are our priority.  As part of our continuing mission to provide you with exceptional heart care, we have created designated Provider Care Teams.  These Care Teams include your primary Cardiologist (physician) and Advanced Practice Providers (APPs -  Physician Assistants and Nurse Practitioners) who all work together to provide you with the care you need, when you need it. . You will need a follow up appointment in 3 months AFTER YOUR ECHOCARDIOGRAM.  You may see Dr. Allyson Sabal or one of the following Advanced Practice Providers on your designated Care Team:   . Corine Shelter, [place] . Azalee Course, PA-C . Micah Flesher, PA-C . Joni Reining, DNP . Theodore Demark, PA-C . Judy Pimple, PA-C . Marjie Skiff, PA-C  Any Other Special Instructions Will Be Listed Below (If Applicable). FOLLOW UP WITH A CLINICAL PHARMACIST IN OUR OFFICE ([facility]) FOR MEDICATION TITRATION IN 2 WEEKS.  REFERRAL TO ADVANCED HEART FAILURE CLINIC

## 2018-09-19 NOTE — Assessment & Plan Note (Signed)
History of left ventricular dysfunction with an EF in the 30% range by 2D echo and recent cardiac cath performed by myself 08/28/2018 via the right radial approach revealing moderate complex proximal LAD disease, moderate mid LAD disease and moderate mid RCA disease.  His Myoview stress test by my eye was low risk with subtle inferior ischemia.  Is completely asymptomatic.  I suspect that his CAD is true true and unrelated and that he has a nonischemic cardiomyopathy.  I am going to begin him on low-dose carvedilol and Entresto.  We will check a basic metabolic panel in 7 to 10 days and he will see Kristen back in 2 weeks for medication titration.  I will have him see the advanced heart failure clinic for pharmacologic optimization.  We will get a 2D echo in 3 months and back to see me after that.  If his EF does not improve he may require insertion of an ICD for primary prevention.

## 2018-09-22 ENCOUNTER — Telehealth: Payer: Self-pay | Admitting: Cardiovascular Disease

## 2018-09-22 NOTE — Telephone Encounter (Signed)
Pt states that pharmacy unable to fill Entresto 24-26 mg medication d/t UMR wanting info regarding pt need for Rx of Entresto. Informed pt that he will be contacted with updates on status of UMR inquiry. Pt agreeable with this

## 2018-09-22 NOTE — Telephone Encounter (Signed)
° ° °  Pt c/o medication issue:  1. Name of Medication: sacubitril-valsartan (ENTRESTO) 24-26 MG  2. How are you currently taking this medication (dosage and times per day)? As written  3. Are you having a reaction (difficulty breathing--STAT)? no  4. What is your medication issue? Patient states the pharmacy was unable to fill medication unable additional information provided to [facility].

## 2018-09-23 NOTE — Telephone Encounter (Signed)
Pt is calling back in regards to medication issues discussed Monday 2/3

## 2018-09-23 NOTE — Telephone Encounter (Signed)
Spoke with pt, aware we are working on cover my meds to get PA.

## 2018-09-24 ENCOUNTER — Telehealth: Payer: Self-pay | Admitting: Cardiovascular Disease

## 2018-09-24 ENCOUNTER — Ambulatory Visit: Payer: Commercial Managed Care - PPO | Admitting: Cardiovascular Disease

## 2018-09-24 NOTE — Telephone Encounter (Signed)
New Message   [place] with Cover my Meds is calling in reference to prior authorization for Entresto. Please call and refer to reference key [id number]

## 2018-09-24 NOTE — Telephone Encounter (Signed)
PA for entresto complete

## 2018-10-07 ENCOUNTER — Ambulatory Visit (INDEPENDENT_AMBULATORY_CARE_PROVIDER_SITE_OTHER): Payer: Commercial Managed Care - PPO | Admitting: Pharmacist

## 2018-10-07 VITALS — BP 148/98 | HR 88 | Resp 15 | Ht 68.0 in | Wt 167.4 lb

## 2018-10-07 DIAGNOSIS — I519 Heart disease, unspecified: Secondary | ICD-10-CM

## 2018-10-07 MED ORDER — SACUBITRIL-VALSARTAN 49-51 MG PO TABS: 1.0000 | ORAL_TABLET | Freq: Two times a day (BID) | ORAL | 0 refills | Status: DC

## 2018-10-07 MED ORDER — CARVEDILOL 6.25 MG PO TABS: 6.2500 mg | ORAL_TABLET | Freq: Two times a day (BID) | ORAL | 3 refills | Status: DC

## 2018-10-07 NOTE — Progress Notes (Signed)
Patient ID: Marcus Vargas                 DOB: 07/16/1956                      MRN: [id number]     HPI: Marcus Vargas is a 63 y.o. male referred by Dr. Allyson Sabal to pharmacist clinic for medication titration. PMH includes hyperlipidemia, HF with EF of 30% on 07/30/2018, and diabetes. Entresto and carvedilol started by Dr Allyson Sabal on 09/24/2018. Patient presents for further medication titration and denies problems with current therapy.  Repeat BMET shows stable renal function and electrolytes.   Current HTN meds:  Entresto 24-26mg  twice daily Carvedilol 3.125mg  twice dialy  BP goal: <130/80  Family History: no family history of cardiac issues  Social History: denies alcohol and tobacco use  Diet: healthy low cholesterol   Exercise: stated working out 2 days   Home BP readings: none available  Wt Readings from Last 3 Encounters:  10/07/18 167 lb 6.4 oz (75.9 kg)  09/19/18 167 lb 12.8 oz (76.1 kg)  08/28/18 165 lb (74.8 kg)   BP Readings from Last 3 Encounters:  10/07/18 (!) 148/98  09/19/18 (!) 152/88  08/28/18 139/87   Pulse Readings from Last 3 Encounters:  10/07/18 88  09/19/18 97  08/28/18 98    Past Medical History:  Diagnosis Date  . Diabetes mellitus without complication (HCC)   . High cholesterol     Current Outpatient Medications on File Prior to Visit  Medication Sig Dispense Refill  . Azelastine HCl 0.15 % SOLN Place 2 sprays into both nostrils daily.     Marland Kitchen CALCIUM CITRATE-VITAMIN D3 PO Take 1 tablet by mouth daily.    . Choline Fenofibrate (FENOFIBRIC ACID) 135 MG CPDR Take 135 mg by mouth daily.     Marland Kitchen glimepiride (AMARYL) 1 MG tablet Take 1 mg by mouth daily with breakfast.     . levocetirizine (XYZAL) 5 MG tablet Take 5 mg by mouth every evening.     . metFORMIN (GLUCOPHAGE-XR) 500 MG 24 hr tablet Take 500-1,000 mg by mouth See admin instructions. Take 500 mg by mouth in the morning and 1000 mg in the evening    . mometasone (NASONEX) 50 MCG/ACT nasal spray Place 2  sprays into the nose daily.     . Multiple Vitamin (MULTIVITAMIN WITH MINERALS) TABS tablet Take 1 tablet by mouth daily.    . Omega-3 Fatty Acids (EQL OMEGA 3 FISH OIL) 1400 MG CAPS Take 1,400 mg by mouth daily.    . simvastatin (ZOCOR) 20 MG tablet Take 20 mg by mouth daily.      No current facility-administered medications on file prior to visit.     No Known Allergies  Blood pressure (!) 148/98, pulse 88, resp. rate 15, height 5\' 8"  (1.727 m), weight 167 lb 6.4 oz (75.9 kg), SpO2 99 %.  Left ventricular dysfunction Blood pressure above goal today and appropriate for further Entresto titration. HR also appropriate to carvedilol titration. Will increase Entresto to 49/51 twice daily and carvedilol to 6.25mg  twice daily. Plan to follow up in 2 week for further titration and repeat BMET.    Raquel Rodriguez-Guzman PharmD, BCPS, CPP [facility] [address] [place] [address] 10/08/2018 4:53 PM

## 2018-10-07 NOTE — Patient Instructions (Addendum)
Return for a  follow up appointment in 2 weeks  Go to the lab TODAY (10/07/2018)  Check your blood pressure at home daily (if able) and keep record of the readings.  Take your BP meds as follows: *INCRESE carvedilol to 6.25mg  twice daily (okay to take 2 tablets of 3.125mg  until all gone) *INCREASE Entresto to 49/51mg  twice daily (okay to take 2 tablets of 24-26 until all gone)  Bring all of your meds, your BP cuff and your record of home blood pressures to your next appointment.  Exercise as you're able, try to walk approximately 30 minutes per day.  Keep salt intake to a minimum, especially watch canned and prepared boxed foods.  Eat more fresh fruits and vegetables and fewer canned items.  Avoid eating in fast food restaurants.    HOW TO TAKE YOUR BLOOD PRESSURE: . Rest 5 minutes before taking your blood pressure. .  Don't smoke or drink caffeinated beverages for at least 30 minutes before. . Take your blood pressure before (not after) you eat. . Sit comfortably with your back supported and both feet on the floor (don't cross your legs). . Elevate your arm to heart level on a table or a desk. . Use the proper sized cuff. It should fit smoothly and snugly around your bare upper arm. There should be enough room to slip a fingertip under the cuff. The bottom edge of the cuff should be 1 inch above the crease of the elbow. . Ideally, take 3 measurements at one sitting and record the average.

## 2018-10-08 ENCOUNTER — Encounter: Payer: Self-pay | Admitting: Pharmacist

## 2018-10-08 LAB — BASIC METABOLIC PANEL
BUN/Creatinine Ratio: 14 (ref 10–24)
BUN: 13 mg/dL (ref 8–27)
CO2: 25 mmol/L (ref 20–29)
Calcium: 9.1 mg/dL (ref 8.6–10.2)
Chloride: 104 mmol/L (ref 96–106)
Creatinine, Ser: 0.9 mg/dL (ref 0.76–1.27)
GFR calc Af Amer: 105 mL/min/{1.73_m2} (ref >59)
GFR calc non Af Amer: 91 mL/min/{1.73_m2} (ref >59)
Glucose: 82 mg/dL (ref 65–99)
Potassium: 3.9 mmol/L (ref 3.5–5.2)
Sodium: 144 mmol/L (ref 134–144)

## 2018-10-08 NOTE — Assessment & Plan Note (Signed)
Blood pressure above goal today and appropriate for further Entresto titration. HR also appropriate to carvedilol titration. Will increase Entresto to 49/51 twice daily and carvedilol to 6.25mg  twice daily. Plan to follow up in 2 week for further titration and repeat BMET.

## 2018-10-21 ENCOUNTER — Ambulatory Visit (INDEPENDENT_AMBULATORY_CARE_PROVIDER_SITE_OTHER): Payer: Commercial Managed Care - PPO | Admitting: Pharmacist

## 2018-10-21 VITALS — BP 132/78 | HR 88 | Resp 15 | Ht 68.0 in | Wt 169.0 lb

## 2018-10-21 DIAGNOSIS — I519 Heart disease, unspecified: Secondary | ICD-10-CM

## 2018-10-21 MED ORDER — CARVEDILOL 12.5 MG PO TABS: 12.5000 mg | ORAL_TABLET | Freq: Two times a day (BID) | ORAL | 1 refills | Status: DC

## 2018-10-21 NOTE — Patient Instructions (Signed)
Return for a  follow up appointment as needed with Pharmacist clinic  Check your blood pressure at home daily (if able) and keep record of the readings.  Take your BP meds as follows: *INCREASE carvedilol dose to 12.5mg  twice daily*   Bring all of your meds, your BP cuff and your record of home blood pressures to your next appointment.  Exercise as you're able, try to walk approximately 30 minutes per day.  Keep salt intake to a minimum, especially watch canned and prepared boxed foods.  Eat more fresh fruits and vegetables and fewer canned items.  Avoid eating in fast food restaurants.    HOW TO TAKE YOUR BLOOD PRESSURE: . Rest 5 minutes before taking your blood pressure. .  Don't smoke or drink caffeinated beverages for at least 30 minutes before. . Take your blood pressure before (not after) you eat. . Sit comfortably with your back supported and both feet on the floor (don't cross your legs). . Elevate your arm to heart level on a table or a desk. . Use the proper sized cuff. It should fit smoothly and snugly around your bare upper arm. There should be enough room to slip a fingertip under the cuff. The bottom edge of the cuff should be 1 inch above the crease of the elbow. . Ideally, take 3 measurements at one sitting and record the average.

## 2018-10-21 NOTE — Progress Notes (Signed)
Patient ID: Marcus Vargas                 DOB: 05-Aug-1956                      MRN: [id number]     HPI: Marcus Vargas is a 63 y.o. male referred by Dr. Allyson Vargas to pharmacist clinic for medication titration. PMH includes hyperlipidemia, HF with EF of 30% on 07/30/2018, and diabetes. Entresto and carvedilol started by Dr Marcus Vargas on 09/24/2018, then titrated on 10/07/2018. Patient presents for further medication titration and denies problems with current therapy.  Repeat BMET shows stable renal function and electrolytes. Noted initial visit to Hf clinic in 2 weeks for further medication titration.   Current HTN meds:  Entresto 49-51mg  twice daily Carvedilol 6.25mg  twice dialy  BP goal: <130/80  Family History: no family history of cardiac issues  Social History: denies alcohol and tobacco use  Diet: healthy low cholesterol , low sodium  Exercise: working out 2-3 days per week  Home BP readings:  16 readings; average 144/84 ; HR 76-90bpm  Wt Readings from Last 3 Encounters:  10/21/18 169 lb (76.7 kg)  10/07/18 167 lb 6.4 oz (75.9 kg)  09/19/18 167 lb 12.8 oz (76.1 kg)   BP Readings from Last 3 Encounters:  10/21/18 132/78  10/07/18 (!) 148/98  09/19/18 (!) 152/88   Pulse Readings from Last 3 Encounters:  10/21/18 88  10/07/18 88  09/19/18 97    Past Medical History:  Diagnosis Date  . Diabetes mellitus without complication (HCC)   . High cholesterol     Current Outpatient Medications on File Prior to Visit  Medication Sig Dispense Refill  . Azelastine HCl 0.15 % SOLN Place 2 sprays into both nostrils daily.     Marland Kitchen CALCIUM CITRATE-VITAMIN D3 PO Take 1 tablet by mouth daily.    . Choline Fenofibrate (FENOFIBRIC ACID) 135 MG CPDR Take 135 mg by mouth daily.     Marland Kitchen glimepiride (AMARYL) 1 MG tablet Take 1 mg by mouth daily with breakfast.     . levocetirizine (XYZAL) 5 MG tablet Take 5 mg by mouth every evening.     . metFORMIN (GLUCOPHAGE-XR) 500 MG 24 hr tablet Take 500-1,000 mg by  mouth See admin instructions. Take 500 mg by mouth in the morning and 1000 mg in the evening    . mometasone (NASONEX) 50 MCG/ACT nasal spray Place 2 sprays into the nose daily.     . Multiple Vitamin (MULTIVITAMIN WITH MINERALS) TABS tablet Take 1 tablet by mouth daily.    . Omega-3 Fatty Acids (EQL OMEGA 3 FISH OIL) 1400 MG CAPS Take 1,400 mg by mouth daily.    . sacubitril-valsartan (ENTRESTO) 49-51 MG Take 1 tablet by mouth 2 (two) times daily. 60 tablet 0  . simvastatin (ZOCOR) 20 MG tablet Take 20 mg by mouth daily.     . valACYclovir (VALTREX) 1000 MG tablet Take 1,000 mg by mouth 2 (two) times daily.     No current facility-administered medications on file prior to visit.     No Known Allergies  Blood pressure 132/78, pulse 88, resp. rate 15, height 5\' 8"  (1.727 m), weight 169 lb (76.7 kg), SpO2 99 %.  Left ventricular dysfunction Blood pressure improved but remains above goal of <130/80. BP, HR and renal function remains appropriate for further medication titration.  Noted appointment will  HF clinic for further medication titration in 2 week.   Will double  carvedilol dose to 12.5mg  twice daily, and continue Entresto at 49-51mg  twice daily. Rosezella [place] for 30day free Sherryll Burger was provided and Rx sent to prefer pharmacy. PA for further [facility] prescription was also intiated.  Plan to follow up as needed after patient completes assessment and medication titration with HF clinic.    Raquel Rodriguez-Guzman PharmD, BCPS, CPP [facility] [address] [place] [address] 10/27/2018 9:08 AM

## 2018-10-27 ENCOUNTER — Encounter: Payer: Self-pay | Admitting: Pharmacist

## 2018-10-27 NOTE — Assessment & Plan Note (Signed)
Blood pressure improved but remains above goal of <130/80. BP, HR and renal function remains appropriate for further medication titration.  Noted appointment will  HF clinic for further medication titration in 2 week.   Will double carvedilol dose to 12.5mg  twice daily, and continue Entresto at 49-51mg  twice daily. Rosezella [place] for 30day free Sherryll Burger was provided and Rx sent to prefer pharmacy. PA for further [facility] prescription was also intiated.  Plan to follow up as needed after patient completes assessment and medication titration with HF clinic.

## 2018-11-03 ENCOUNTER — Ambulatory Visit (HOSPITAL_COMMUNITY)
Admission: RE | Admit: 2018-11-03 | Discharge: 2018-11-03 | Disposition: A | Discharge status: Home / Self Care | Payer: Commercial Managed Care - PPO | Source: Ambulatory Visit | Attending: Cardiology | Admitting: Cardiology

## 2018-11-03 ENCOUNTER — Other Ambulatory Visit: Payer: Self-pay

## 2018-11-03 ENCOUNTER — Encounter (HOSPITAL_COMMUNITY): Payer: Self-pay

## 2018-11-03 VITALS — BP 138/74 | HR 78 | Ht 68.0 in | Wt 166.6 lb

## 2018-11-03 DIAGNOSIS — I5022 Chronic systolic (congestive) heart failure: Secondary | ICD-10-CM | POA: Diagnosis not present

## 2018-11-03 DIAGNOSIS — E785 Hyperlipidemia, unspecified: Secondary | ICD-10-CM | POA: Insufficient documentation

## 2018-11-03 DIAGNOSIS — I2510 Atherosclerotic heart disease of native coronary artery without angina pectoris: Secondary | ICD-10-CM | POA: Insufficient documentation

## 2018-11-03 DIAGNOSIS — E119 Type 2 diabetes mellitus without complications: Secondary | ICD-10-CM | POA: Diagnosis not present

## 2018-11-03 DIAGNOSIS — E782 Mixed hyperlipidemia: Secondary | ICD-10-CM | POA: Diagnosis not present

## 2018-11-03 DIAGNOSIS — R0683 Snoring: Secondary | ICD-10-CM | POA: Diagnosis not present

## 2018-11-03 DIAGNOSIS — Z7982 Long term (current) use of aspirin: Secondary | ICD-10-CM | POA: Diagnosis not present

## 2018-11-03 DIAGNOSIS — I110 Hypertensive heart disease with heart failure: Secondary | ICD-10-CM | POA: Insufficient documentation

## 2018-11-03 DIAGNOSIS — Z79899 Other long term (current) drug therapy: Secondary | ICD-10-CM | POA: Diagnosis not present

## 2018-11-03 DIAGNOSIS — E7800 Pure hypercholesterolemia, unspecified: Secondary | ICD-10-CM | POA: Insufficient documentation

## 2018-11-03 DIAGNOSIS — I10 Essential (primary) hypertension: Secondary | ICD-10-CM

## 2018-11-03 DIAGNOSIS — Z7984 Long term (current) use of oral hypoglycemic drugs: Secondary | ICD-10-CM | POA: Insufficient documentation

## 2018-11-03 LAB — BASIC METABOLIC PANEL
Anion gap: 9 (ref 5–15)
BUN: 12 mg/dL (ref 8–23)
CO2: 24 mmol/L (ref 22–32)
Calcium: 8.7 mg/dL — ABNORMAL LOW (ref 8.9–10.3)
Chloride: 108 mmol/L (ref 98–111)
Creatinine, Ser: 0.88 mg/dL (ref 0.61–1.24)
GFR calc Af Amer: >60 mL/min (ref >60)
GFR calc non Af Amer: >60 mL/min (ref >60)
Glucose, Bld: 161 mg/dL — ABNORMAL HIGH (ref 70–99)
Potassium: 3.5 mmol/L (ref 3.5–5.1)
Sodium: 141 mmol/L (ref 135–145)

## 2018-11-03 MED ORDER — SPIRONOLACTONE 25 MG PO TABS: 12.5000 mg | ORAL_TABLET | Freq: Every day | ORAL | 3 refills | Status: DC

## 2018-11-03 MED ORDER — CARVEDILOL 12.5 MG PO TABS: 12.5000 mg | ORAL_TABLET | Freq: Two times a day (BID) | ORAL | 3 refills | Status: DC

## 2018-11-03 MED ORDER — SACUBITRIL-VALSARTAN 49-51 MG PO TABS: 1.0000 | ORAL_TABLET | Freq: Two times a day (BID) | ORAL | 3 refills | Status: DC

## 2018-11-03 NOTE — Patient Instructions (Signed)
Lab work done today. We will notify you of any abnormal lab work.  Lab work will need to be done again in 10-14 days  START Spironolactone 12.5mg  (0.5 tab) daily  REFILLED TO [facility] Carvedilol, Entresto, and Spironolactone 90 day supplies.  Please follow up with our Advanced Practice Provider in 4 weeks.

## 2018-11-03 NOTE — Progress Notes (Addendum)
Advanced Heart Failure Clinic Note   Referring Physician: PCP: Daisy Floro, MD PCP-Cardiologist: No primary care provider on file.   HPI:  Marcus Vargas is a 63 y.o. male with chronic systolic CHF, Mod CAD, HLD, and DM2.   Last seen by Dr. Allyson Sabal 09/19/18. Had LHC as below with CAD but thought LV dysfunction out of proportion with myoview as below. Has been in pharmacy clinic x 2 for med titration.   He presents today to establish in the HF clinic for further titration of medicine. Has been following in [facility] pharmacy clinic. Last visit coreg increased. He has been feeling good overall. His HF was discovered incidentally as part of a screening for [employer] insurance. He has no known personal or family history of cardiac issues. He denies SOB with ADLs or walking up 3-4 flights of stairs. No CP, lightheadedness or dizziness. No ETOH or tobacco use. No drug use. He works as an Art gallery manager for [employer]. He has had mild ankle edema that has improved greatly with Entresto.   Echo 07/30/18 LVEF 30-35%, Grade 1 DD, Mild/Mod AI, Trivial MI, Trivial TI  Myoview 08/07/18  The left ventricular ejection fraction is severely decreased (<30%).  Nuclear stress EF: 28%.  There was no ST segment deviation noted during stress.  This is a high risk study.  Findings consistent with ischemia.  Reversible medium-sized, intermediate intensity basal to mid inferior perfusion defect. This is concerning for ischemia.  LHC 08/28/18   Mid RCA lesion is 70% stenosed.  Ost LAD to Prox LAD lesion is 70% stenosed.  There is severe left ventricular systolic dysfunction.  LV end diastolic pressure is moderately elevated.  The left ventricular ejection fraction is less than 25% by visual estimate.  Review of systems complete and found to be negative unless listed in HPI.    Past Medical History:  Diagnosis Date  . Diabetes mellitus without complication (HCC)   . High cholesterol     Current Outpatient  Medications  Medication Sig Dispense Refill  . aspirin 81 MG chewable tablet Chew 81 mg by mouth daily.    . Azelastine HCl 0.15 % SOLN Place 2 sprays into both nostrils daily.     Marland Kitchen CALCIUM CITRATE-VITAMIN D3 PO Take 1 tablet by mouth daily.    . carvedilol (COREG) 12.5 MG tablet Take 1 tablet (12.5 mg total) by mouth 2 (two) times daily. 60 tablet 1  . Choline Fenofibrate (FENOFIBRIC ACID) 135 MG CPDR Take 135 mg by mouth daily.     Marland Kitchen glimepiride (AMARYL) 1 MG tablet Take 1 mg by mouth daily with breakfast.     . levocetirizine (XYZAL) 5 MG tablet Take 5 mg by mouth every evening.     . metFORMIN (GLUCOPHAGE-XR) 500 MG 24 hr tablet Take 500-1,000 mg by mouth See admin instructions. Take 500 mg by mouth in the morning and 1000 mg in the evening    . mometasone (NASONEX) 50 MCG/ACT nasal spray Place 2 sprays into the nose daily.     . Multiple Vitamin (MULTIVITAMIN WITH MINERALS) TABS tablet Take 1 tablet by mouth daily.    . Omega-3 Fatty Acids (EQL OMEGA 3 FISH OIL) 1400 MG CAPS Take 1,400 mg by mouth daily.    . sacubitril-valsartan (ENTRESTO) 49-51 MG Take 1 tablet by mouth 2 (two) times daily. 60 tablet 0  . simvastatin (ZOCOR) 20 MG tablet Take 20 mg by mouth daily.     . valACYclovir (VALTREX) 1000 MG tablet Take 1,000  mg by mouth 2 (two) times daily.     No current facility-administered medications for this encounter.    No Known Allergies  Social History   Socioeconomic History  . Marital status: Married    Spouse name: Not on file  . Number of children: Not on file  . Years of education: Not on file  . Highest education level: Not on file  Occupational History  . Not on file  Social Needs  . Financial resource strain: Not on file  . Food insecurity:    Worry: Not on file    Inability: Not on file  . Transportation needs:    Medical: Not on file    Non-medical: Not on file  Tobacco Use  . Smoking status: Never Smoker  . Smokeless tobacco: Never Used  Substance and  Sexual Activity  . Alcohol use: No  . Drug use: No  . Sexual activity: Not on file  Lifestyle  . Physical activity:    Days per week: Not on file    Minutes per session: Not on file  . Stress: Not on file  Relationships  . Social connections:    Talks on phone: Not on file    Gets together: Not on file    Attends religious service: Not on file    Active member of club or organization: Not on file    Attends meetings of clubs or organizations: Not on file    Relationship status: Not on file  . Intimate partner violence:    Fear of current or ex partner: Not on file    Emotionally abused: Not on file    Physically abused: Not on file    Forced sexual activity: Not on file  Other Topics Concern  . Not on file  Social History Narrative  . Not on file   Family History He has no known cardiac family history. No family history of sudden cardiac death or heart failure.   Vitals:   11/03/18 0921  BP: 138/74  Pulse: 78  SpO2: 98%  Weight: 75.6 kg (166 lb 9.6 oz)  Height: 5\' 8"  (1.727 m)    Wt Readings from Last 3 Encounters:  11/03/18 75.6 kg (166 lb 9.6 oz)  10/21/18 76.7 kg (169 lb)  10/07/18 75.9 kg (167 lb 6.4 oz)    PHYSICAL EXAM: General:  Well appearing. No respiratory difficulty HEENT: normal Neck: supple. no JVD. Carotids 2+ bilat; no bruits. No lymphadenopathy or thyromegaly appreciated. Cor: PMI nondisplaced. Regular rate & rhythm. No rubs, gallops or murmurs. Lungs: clear Abdomen: soft, nontender, nondistended. No hepatosplenomegaly. No bruits or masses. Good bowel sounds. Extremities: no cyanosis, clubbing, or rash. Trace ankle edema above socks.  Neuro: alert & oriented x 3, cranial nerves grossly intact. moves all 4 extremities w/o difficulty. Affect pleasant.  ECG: NSR 82 bpm, personally reviewed.  ASSESSMENT & PLAN:  1. 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    - Has not required lasix. - Continue Entresto 49/51 mg BID - Continue Coreg 12.5 mg BID - Add spiro 12.5 mg qhs. BMET today and 10 days.  - No need for digoxin with NYHA I-II symptoms.  - No indication for Hydral/Imdur at this time.  2. Mod CAD - LHC 08/28/18 with Mid RCA lesion is 70% stenosed and Ost LAD to Prox LAD  lesion is 70% stenosed. Medical therapy pursued.  - No s/s of ischemia.    - Continue statin. 3. HTN - Meds as above in setting of treating his CHF 4. DM2 - Per PCP 5. HLD - Per [facility] 6. Snoring - Will send for sleep study.   Doing well overall. Continue to titrate meds as above. Refer for sleep study. If EF improves on follow up Echo, can graduate from HF clinic back to [facility].   Graciella Freer, PA-C 11/03/18   Greater than 50% of the 45 minute visit was spent in counseling/coordination of care regarding disease state education, salt/fluid restriction, sliding scale diuretics, and medication compliance.

## 2018-11-14 ENCOUNTER — Other Ambulatory Visit: Payer: Self-pay

## 2018-11-14 ENCOUNTER — Ambulatory Visit (HOSPITAL_COMMUNITY)
Admission: RE | Admit: 2018-11-14 | Discharge: 2018-11-14 | Disposition: A | Discharge status: Home / Self Care | Payer: Commercial Managed Care - PPO | Source: Ambulatory Visit | Attending: Cardiology | Admitting: Cardiology

## 2018-11-14 DIAGNOSIS — I5022 Chronic systolic (congestive) heart failure: Secondary | ICD-10-CM | POA: Diagnosis not present

## 2018-11-14 LAB — BASIC METABOLIC PANEL
Anion gap: 10 (ref 5–15)
BUN: 21 mg/dL (ref 8–23)
CO2: 24 mmol/L (ref 22–32)
Calcium: 11.1 mg/dL — ABNORMAL HIGH (ref 8.9–10.3)
Chloride: 108 mmol/L (ref 98–111)
Creatinine, Ser: 1.02 mg/dL (ref 0.61–1.24)
GFR calc Af Amer: >60 mL/min (ref >60)
GFR calc non Af Amer: >60 mL/min (ref >60)
Glucose, Bld: 154 mg/dL — ABNORMAL HIGH (ref 70–99)
Potassium: 4.3 mmol/L (ref 3.5–5.1)
Sodium: 142 mmol/L (ref 135–145)

## 2018-12-01 ENCOUNTER — Other Ambulatory Visit: Payer: Self-pay

## 2018-12-01 ENCOUNTER — Encounter (HOSPITAL_COMMUNITY): Payer: Self-pay

## 2018-12-01 ENCOUNTER — Ambulatory Visit (HOSPITAL_COMMUNITY)
Admission: RE | Admit: 2018-12-01 | Discharge: 2018-12-01 | Disposition: A | Discharge status: Home / Self Care | Payer: Commercial Managed Care - PPO | Source: Ambulatory Visit | Attending: Cardiology | Admitting: Cardiology

## 2018-12-01 DIAGNOSIS — I5022 Chronic systolic (congestive) heart failure: Secondary | ICD-10-CM

## 2018-12-01 DIAGNOSIS — I2510 Atherosclerotic heart disease of native coronary artery without angina pectoris: Secondary | ICD-10-CM

## 2018-12-01 DIAGNOSIS — I10 Essential (primary) hypertension: Secondary | ICD-10-CM

## 2018-12-01 DIAGNOSIS — R0683 Snoring: Secondary | ICD-10-CM

## 2018-12-01 MED ORDER — CARVEDILOL 25 MG PO TABS: 25.0000 mg | ORAL_TABLET | Freq: Two times a day (BID) | ORAL | 3 refills | Status: DC

## 2018-12-01 NOTE — Patient Instructions (Addendum)
Increase Coreg 25mg  (1 tab) twice a day. A prescription was sent to [facility].   Your physician recommends that you schedule a follow-up appointment in: 6 weeks with NP/PA clinic.  You will be contacted to schedule this appointment.  A left message was left with [facility]. I will continue to reach out to them to assess status of your referral.

## 2018-12-01 NOTE — Progress Notes (Signed)
Heart Failure TeleHealth Note  Due to national recommendations of social distancing due to COVID 19, Audio/video telehealth visit is felt to be most appropriate for this patient at this time.  See MyChart message from today for patient consent regarding telehealth [facility].  Date:  12/01/2018   ID:  Marcus DroneBryant Lowery, DOB [birth year], MRN [id number]  Location: Home  Provider location: [place] [facility] Type of Visit: Established patient   PCP:  Daisy Florooss, Charles Alan, MD  Cardiologist: Dr Allyson SabalBerry  Chief Complaint: Chronic systolic HF   History of Present Illness: Marcus Vargas is a 63 y.o. male with a history of chronic systolic CHF, Mod CAD, HLD, and DM2.   Echo 07/30/18 LVEF 30-35%, Grade 1 DD, Mild/Mod AI, Trivial MI, Trivial TI  LHC 08/28/18   Mid RCA lesion is 70% stenosed.  Ost LAD to Prox LAD lesion is 70% stenosed.  There is severe left ventricular systolic dysfunction.  LV end diastolic pressure is moderately elevated.  The left ventricular ejection fraction is less than 25% by visual estimate.  See as a new patient in HF clinic 3/16. Cleda DaubSpiro was added and he was referred for a sleep study. Labs after med change remained stable with creatinine 1.02, K 4.3 on 3/27.  He presents via Web designeraudio/video conferencing for a telehealth visit today. Overall doing well. No SOB, edema, orthopnea, or PND. He is exercising on Boflex for 15 minutes daily. He is also doing yardwork regularly. No CP or dizziness. Rare dry cough from allergies. No fever or chills. Appetite and energy level good. Weight is down to 163 lbs on his home scale. Taking all medication every day. Gets meds through [facility]. Getting groceries delivered.    BP generally ~ 130/70, 129/72 today HR 75-90s, 80 today  He denies symptoms worrisome for COVID 19.   Past Medical History:  Diagnosis Date  . Diabetes mellitus without complication (HCC)   . High cholesterol    Past Surgical History:   Procedure Laterality Date  . LEFT HEART CATH AND CORONARY ANGIOGRAPHY N/A 08/28/2018   Procedure: LEFT HEART CATH AND CORONARY ANGIOGRAPHY;  Surgeon: Runell GessBerry, Jonathan J, MD;  Location: [facility] INVASIVE CV LAB;  Service: Cardiovascular;  Laterality: N/A;  . SINUS SURGERY WITH INSTATRAK       Current Outpatient Medications  Medication Sig Dispense Refill  . aspirin 81 MG chewable tablet Chew 81 mg by mouth daily.    . Azelastine HCl 0.15 % SOLN Place 2 sprays into both nostrils daily.     Marland Kitchen. CALCIUM CITRATE-VITAMIN D3 PO Take 1 tablet by mouth daily.    . carvedilol (COREG) 12.5 MG tablet Take 1 tablet (12.5 mg total) by mouth 2 (two) times daily. 180 tablet 3  . Choline Fenofibrate (FENOFIBRIC ACID) 135 MG CPDR Take 135 mg by mouth daily.     Marland Kitchen. glimepiride (AMARYL) 1 MG tablet Take 1 mg by mouth daily with breakfast.     . levocetirizine (XYZAL) 5 MG tablet Take 5 mg by mouth every evening.     . metFORMIN (GLUCOPHAGE-XR) 500 MG 24 hr tablet Take 500-1,000 mg by mouth See admin instructions. Take 500 mg by mouth in the morning and 1000 mg in the evening    . mometasone (NASONEX) 50 MCG/ACT nasal spray Place 2 sprays into the nose daily.     . Multiple Vitamin (MULTIVITAMIN WITH MINERALS) TABS tablet Take 1 tablet by mouth daily.    . Omega-3 Fatty Acids (EQL  OMEGA 3 FISH OIL) 1400 MG CAPS Take 1,400 mg by mouth daily.    . sacubitril-valsartan (ENTRESTO) 49-51 MG Take 1 tablet by mouth 2 (two) times daily. 180 tablet 3  . simvastatin (ZOCOR) 20 MG tablet Take 20 mg by mouth daily.     Marland Kitchen spironolactone (ALDACTONE) 25 MG tablet Take 0.5 tablets (12.5 mg total) by mouth daily. 45 tablet 3  . valACYclovir (VALTREX) 1000 MG tablet Take 1,000 mg by mouth 2 (two) times daily.     No current facility-administered medications for this encounter.     Allergies:   Patient has no known allergies.   Social History:  The patient  reports that he has never smoked. He has never used smokeless tobacco. He  reports that he does not drink alcohol or use drugs.   Family History:  The patient's family history is not on file.   ROS:  Please see the history of present illness.   All other systems are personally reviewed and negative.   Exam:  (Video/Tele Health Call; Exam is subjective and or/visual.) General:  Speaks in full sentences. No resp difficulty. Lungs: Normal respiratory effort with conversation.  Abdomen: Non-distended per patient report Extremities: Pt denies edema. Neuro: Alert & oriented x 3.   Recent Labs: 07/09/2018: BNP 78.4 08/05/2018: NT-Pro BNP 400 08/08/2018: Hemoglobin 14.2; Platelets 242 11/14/2018: BUN 21; Creatinine, Ser 1.02; Potassium 4.3; Sodium 142  Personally reviewed   Wt Readings from Last 3 Encounters:  11/03/18 75.6 kg (166 lb 9.6 oz)  10/21/18 76.7 kg (169 lb)  10/07/18 75.9 kg (167 lb 6.4 oz)      ASSESSMENT AND PLAN:  1. 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 - Continue Entresto 49/51 mg BID. Would anticipate going up on Entresto next visit.  - Increase coreg to 25 mg BID. Discussed side effects to watch out for - Continue spiro 12.5 mg qHS - No need for digoxin with NYHA I-II symptoms.  - No indication for Hydral/Imdur at this time.  - Scheduled for repeat echo later in April. I suspect this will be delayed due to COVID. If EF normal on repeat echo, will plan to discharge from HF clinic and go back to [facility]  - Consider cardiac MRI if EF remains low.  2. Mod CAD - LHC 08/28/18 with Mid RCA lesion is 70% stenosed and Ost LAD to Prox LAD lesion is 70% stenosed. Medical therapy pursued.  - No s/s of ischemia.    - Continue statin, ASA, and BB 3. HTN - Manage with HF medications.  4. Snoring - Referred for sleep study last visit. He hasn't heard from them. Follow up on this.  Aware that this will be delayed due to COVID.    COVID screen The patient does not have any symptoms that suggest any further testing/ screening at this time.  Social distancing reinforced today.  Relevant cardiac medications were reviewed at length with the patient today.   The patient does not have concerns regarding their medications at this time.   The following changes were made today: Increase coreg to 25 mg BID Recommended follow-up:  6 weeks by telephone  Today, I have spent 24 minutes with the patient with telehealth technology discussing the above issues .    Signed, Alford Highland, NP  12/01/2018  3:19 PM  [facility] [address] Heart and Vascular [place] [address] [phone] (office) [phone] (fax)

## 2018-12-01 NOTE — Addendum Note (Signed)
Encounter addended by: Marisa Hua, RN on: 12/01/2018 4:22 PM  Actions taken: Pharmacy for encounter modified, Order list changed, Clinical Note Signed

## 2018-12-01 NOTE — Progress Notes (Signed)
Discussed AVS with patient. Questions answered. Summary sent to patient via mychart as requested. Pt appreciative.

## 2018-12-11 ENCOUNTER — Ambulatory Visit (HOSPITAL_COMMUNITY): Payer: Commercial Managed Care - PPO | Attending: Cardiovascular Disease

## 2018-12-11 ENCOUNTER — Other Ambulatory Visit: Payer: Self-pay

## 2018-12-11 DIAGNOSIS — I519 Heart disease, unspecified: Secondary | ICD-10-CM | POA: Insufficient documentation

## 2018-12-17 ENCOUNTER — Telehealth: Payer: Self-pay | Admitting: Cardiovascular Disease

## 2018-12-17 NOTE — Telephone Encounter (Signed)
New Message:   Patient calling concerning a code also I tried to change the time for the virtual. Please call patient.

## 2018-12-17 NOTE — Telephone Encounter (Signed)
Spoke with the pt and he is willing to make his OV 12/19/18 a VIDEO visit.. he has a smart pone [phone] heconsented verbally.         Virtual Visit Pre-Appointment Phone Call   1. "What is the BEST phone number to call the day of the visit?" - include this in appointment notes [phone]  2. "Do you have or have access to (through a family member/friend) a smartphone with video capability that we can use for your visit?" a. If yes - list this number in appt notes as "cell" (if different from BEST phone #) and list the appointment type as a VIDEO visit in appointment notes b. If no - list the appointment type as a PHONE visit in appointment notes  3. Confirm consent - "In the setting of the current Covid19 crisis, you are scheduled for a (phone or video) visit with your provider on (date) at (time).  Just as we do with many in-office visits, in order for you to participate in this visit, we must obtain consent.  If you'd like, I can send this to your mychart (if signed up) or email for you to review.  Otherwise, I can obtain your verbal consent now.  All virtual visits are billed to your insurance company just like a normal visit would be.  By agreeing to a virtual visit, we'd like you to understand that the technology does not allow for your provider to perform an examination, and thus may limit your provider's ability to fully assess your condition. If your provider identifies any concerns that need to be evaluated in person, we will make arrangements to do so.  Finally, though the technology is pretty good, we cannot assure that it will always work on either your or our end, and in the setting of a video visit, we may have to convert it to a phone-only visit.  In either situation, we cannot ensure that we have a secure connection.  Are you willing to proceed?" STAFF: Did the patient verbally acknowledge consent to telehealth visit? Document YES/NO here: YES  4. Advise patient to be prepared  - "Two hours prior to your appointment, go ahead and check your blood pressure, pulse, oxygen saturation, and your weight (if you have the equipment to check those) and write them all down. When your visit starts, your provider will ask you for this information. If you have an Apple Watch or Kardia device, please plan to have heart rate information ready on the day of your appointment. Please have a pen and paper handy nearby the day of the visit as well."  5. Give patient instructions for MyChart download to smartphone OR Doximity/Doxy.me as below if video visit (depending on what platform provider is using)  6. Inform patient they will receive a phone call 15 minutes prior to their appointment time (may be from unknown caller ID) so they should be prepared to answer    TELEPHONE CALL NOTE  Junious Schooley has been deemed a candidate for a follow-up tele-health visit to limit community exposure during the Covid-19 pandemic. I spoke with the patient via phone to ensure availability of phone/video source, confirm preferred email & phone number, and discuss instructions and expectations.  I reminded Cadin Oetting to be prepared with any vital sign and/or heart rhythm information that could potentially be obtained via home monitoring, at the time of his visit. I reminded Balian Chaudoin to expect a phone call prior to his visit.  Bertram Millard,  RN 12/17/2018 1:40 PM   INSTRUCTIONS FOR DOWNLOADING THE MYCHART APP TO SMARTPHONE  - The patient must first make sure to have activated MyChart and know their login information - If Apple, go to [employer] and type in MyChart in the search bar and download the app. If Android, ask patient to go to [employer] and type in [place] in the search bar and download the app. The app is free but as with any other app downloads, their phone may require them to verify saved payment information or Apple/Android password.  - The patient will need to then log into the app  with their MyChart username and password, and select [facility] as their healthcare provider to link the account. When it is time for your visit, go to the MyChart app, find appointments, and click Begin Video Visit. Be sure to Select Allow for your device to access the Microphone and Camera for your visit. You will then be connected, and your provider will be with you shortly.  **If they have any issues connecting, or need assistance please contact MyChart service desk [phone])**  **If using a computer, in order to ensure the best quality for their visit they will need to use either of the following Internet Browsers: [employer] or Google Chrome**  IF USING DOXIMITY or DOXY.ME - The patient will receive a link just prior to their visit by text.     FULL LENGTH CONSENT FOR TELE-HEALTH VISIT   I hereby voluntarily request, consent and authorize [facility] and its employed or contracted physicians, physician assistants, nurse practitioners or other licensed health care professionals (the Practitioner), to provide me with telemedicine health care services (the "Services") as deemed necessary by the treating Practitioner. I acknowledge and consent to receive the Services by the Practitioner via telemedicine. I understand that the telemedicine visit will involve communicating with the Practitioner through live audiovisual communication technology and the disclosure of certain medical information by electronic transmission. I acknowledge that I have been given the opportunity to request an in-person assessment or other available alternative prior to the telemedicine visit and am voluntarily participating in the telemedicine visit.  I understand that I have the right to withhold or withdraw my consent to the use of telemedicine in the course of my care at any time, without affecting my right to future care or treatment, and that the Practitioner or I may terminate the  telemedicine visit at any time. I understand that I have the right to inspect all information obtained and/or recorded in the course of the telemedicine visit and may receive copies of available information for a reasonable fee.  I understand that some of the potential risks of receiving the Services via telemedicine include:  Marland Kitchen Delay or interruption in medical evaluation due to technological equipment failure or disruption; . Information transmitted may not be sufficient (e.g. poor resolution of images) to allow for appropriate medical decision making by the Practitioner; and/or  . In rare instances, security protocols could fail, causing a breach of personal health information.  Furthermore, I acknowledge that it is my responsibility to provide information about my medical history, conditions and care that is complete and accurate to the best of my ability. I acknowledge that Practitioner's advice, recommendations, and/or decision may be based on factors not within their control, such as incomplete or inaccurate data provided by me or distortions of diagnostic images or specimens that may result from electronic transmissions. I understand that the practice of  medicine is not an Visual merchandiserexact science and that Practitioner makes no warranties or guarantees regarding treatment outcomes. I acknowledge that I will receive a copy of this consent concurrently upon execution via email to the email address I last provided but may also request a printed copy by calling the office of [facility].    I understand that my insurance will be billed for this visit.   I have read or had this consent read to me. . I understand the contents of this consent, which adequately explains the benefits and risks of the Services being provided via telemedicine.  . I have been provided ample opportunity to ask questions regarding this consent and the Services and have had my questions answered to my satisfaction. . I give my informed  consent for the services to be provided through the use of telemedicine in my medical care  By participating in this telemedicine visit I agree to the above.

## 2018-12-18 ENCOUNTER — Telehealth: Payer: Self-pay | Admitting: Cardiovascular Disease

## 2018-12-18 NOTE — Telephone Encounter (Signed)
Mychart, smartphone. Pre reg complete 12/18/18 AF

## 2018-12-19 ENCOUNTER — Telehealth: Payer: Self-pay

## 2018-12-19 ENCOUNTER — Telehealth (INDEPENDENT_AMBULATORY_CARE_PROVIDER_SITE_OTHER): Payer: Commercial Managed Care - PPO | Admitting: Cardiovascular Disease

## 2018-12-19 ENCOUNTER — Encounter: Payer: Self-pay | Admitting: Cardiovascular Disease

## 2018-12-19 VITALS — BP 135/73 | HR 75 | Ht 68.0 in | Wt 164.0 lb

## 2018-12-19 DIAGNOSIS — E782 Mixed hyperlipidemia: Secondary | ICD-10-CM

## 2018-12-19 DIAGNOSIS — I519 Heart disease, unspecified: Secondary | ICD-10-CM | POA: Diagnosis not present

## 2018-12-19 DIAGNOSIS — R7989 Other specified abnormal findings of blood chemistry: Secondary | ICD-10-CM

## 2018-12-19 DIAGNOSIS — R9431 Abnormal electrocardiogram [ECG] [EKG]: Secondary | ICD-10-CM | POA: Diagnosis not present

## 2018-12-19 NOTE — Progress Notes (Signed)
Virtual Visit via Video Note   This visit type was conducted due to national recommendations for restrictions regarding the COVID-19 Pandemic (e.g. social distancing) in an effort to limit this patient's exposure and mitigate transmission in our community.  Due to his co-morbid illnesses, this patient is at least at moderate risk for complications without adequate follow up.  This format is felt to be most appropriate for this patient at this time.  All issues noted in this document were discussed and addressed.  A limited physical exam was performed with this format.  Please refer to the patient's chart for his consent to telehealth for [facility].   Date:  12/19/2018   ID:  Marcus Vargas, DOB [birth year], MRN [id number]  Patient Location: Home Provider Location: Home  PCP:  Daisy Floro, MD  Cardiologist: Dr. Nanetta Batty Electrophysiologist:  None   Evaluation Performed:  Follow-Up Visit  Chief Complaint: Ischemic/nonischemic cardiomyopathy  History of Present Illness:    Marcus Vargas a 63 y.o.thin appearing married Caucasian male father of 4 children, grandfather one grandchild referred by Dr. Tenny Craw for cardiovascular evaluation because of elevated proBNP. 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  I last saw him in the office 1/31/2020his cardiac risk factors are notable for true hyperlipidemia and diabetes. There is no family history of heart disease. Never had a heart attack or stroke. He denies chest pain or shortness of breath. He does get diaphoretic at times when he is exercising. Apparently for insurance physical blood work revealed an elevated proBNP and he was referred here for further evaluation.  A 2D echocardiogram revealed severe LV dysfunction with an EF in the 25% range and a Myoview stress test suggested ischemia in the RCA distribution.  Based on this, he underwent right and left heart cath by myself 08/28/2018 revealing moderate CAD (proximal  LAD with aneurysmal dilatation, distal RCA) and severe LV dysfunction.  He was ultimately placed on heart failure pharmacology including carvedilol which was uptitrated, spironolactone and Entresto.  Since I saw him 3 months ago a recent 2D echo performed 12/11/2018 showed improvement in his EF up to 40 to 45%.  He is relatively asymptomatic. The patient does not have symptoms concerning for COVID-19 infection (fever, chills, cough, or new shortness of breath).    Past Medical History:  Diagnosis Date   Diabetes mellitus without complication (HCC)    High cholesterol    Past Surgical History:  Procedure Laterality Date   LEFT HEART CATH AND CORONARY ANGIOGRAPHY N/A 08/28/2018   Procedure: LEFT HEART CATH AND CORONARY ANGIOGRAPHY;  Surgeon: Runell Gess, MD;  [facility];  Service: Cardiovascular;  Laterality: N/A;   SINUS SURGERY WITH INSTATRAK       Current Meds  Medication Sig   aspirin 81 MG chewable tablet Chew 81 mg by mouth daily.   Azelastine HCl 0.15 % SOLN Place 2 sprays into both nostrils daily.    CALCIUM CITRATE-VITAMIN D3 PO Take 1 tablet by mouth daily.   carvedilol (COREG) 25 MG tablet Take 1 tablet (25 mg total) by mouth 2 (two) times daily.   Choline Fenofibrate (FENOFIBRIC ACID) 135 MG CPDR Take 135 mg by mouth daily.    glimepiride (AMARYL) 1 MG tablet Take 1 mg by mouth daily with breakfast.    levocetirizine (XYZAL) 5 MG tablet Take 5 mg by mouth every evening.    metFORMIN (GLUCOPHAGE-XR) 500 MG 24 hr tablet Take 500-1,000 mg by mouth See admin  instructions. Take 500 mg by mouth in the morning and 1000 mg in the evening   mometasone (NASONEX) 50 MCG/ACT nasal spray Place 2 sprays into the nose daily.    Multiple Vitamin (MULTIVITAMIN WITH MINERALS) TABS tablet Take 1 tablet by mouth daily.   Omega-3 Fatty Acids (EQL OMEGA 3 FISH OIL) 1400 MG CAPS Take 1,400 mg by mouth daily.   sacubitril-valsartan (ENTRESTO) 49-51 MG Take 1  tablet by mouth 2 (two) times daily.   simvastatin (ZOCOR) 20 MG tablet Take 20 mg by mouth daily.    spironolactone (ALDACTONE) 25 MG tablet Take 0.5 tablets (12.5 mg total) by mouth daily.   valACYclovir (VALTREX) 1000 MG tablet Take 1,000 mg by mouth 2 (two) times daily.     Allergies:   Patient has no known allergies.   Social History   Tobacco Use   Smoking status: Never Smoker   Smokeless tobacco: Never Used  Substance Use Topics   Alcohol use: No   Drug use: No     Family Hx: The patient's family history is not on file.  ROS:   Please see the history of present illness.     All other systems reviewed and are negative.   Prior CV studies:   The following studies were reviewed today:  2D echocardiogram 12/11/2018  Labs/Other Tests and Data Reviewed:    EKG:  No ECG reviewed.  Recent Labs: 07/09/2018: BNP 78.4 08/05/2018: NT-Pro BNP 400 08/08/2018: Hemoglobin 14.2; Platelets 242 11/14/2018: BUN 21; Creatinine, Ser 1.02; Potassium 4.3; Sodium 142   Recent Lipid Panel No results found for: CHOL, TRIG, HDL, CHOLHDL, LDLCALC, LDLDIRECT  Wt Readings from Last 3 Encounters:  12/19/18 164 lb (74.4 kg)  11/03/18 166 lb 9.6 oz (75.6 kg)  10/21/18 169 lb (76.7 kg)     Objective:    Vital Signs:  BP 135/73    Pulse 75    Ht 5\' 8"  (1.727 m)    Wt 164 lb (74.4 kg)    BMI 24.94 kg/m    VITAL SIGNS:  reviewed GEN:  no acute distress RESPIRATORY:  normal respiratory effort, symmetric expansion NEURO:  alert and oriented x 3, no obvious focal deficit PSYCH:  normal affect  ASSESSMENT & PLAN:    1. Nonischemic cardiomyopathy- 2D echo performed 07/31/2018 revealed an EF of 30 to 35%.  Right left heart cath performed by myself 08/28/2018 revealed moderate two-vessel disease involving the proximal LAD (aneurysmal segment) and distal RCA.  He did have a Myoview stress test 08/07/2018 that showed inferior ischemia although is completely asymptomatic.  He was placed on  optimal heart failure pharmacology and we are following his LV function closely as an outpatient. 2. Hyperlipidemia- history of hyperlipidemia on simvastatin and fenofibrate with a lipid profile performed 01/31/2018 revealing total cluster 146, LDL 67 and HDL of 62 3. Essential hypertension- blood pressure measured by the patient today was 135/73 with a pulse of 75.  He is on carvedilol, spironolactone and Entresto 4. Type 2 diabetes- followed by his PCP.  His last A1c was 6.5  COVID-19 Education: The signs and symptoms of COVID-19 were discussed with the patient and how to seek care for testing (follow up with PCP or arrange E-visit).  The importance of social distancing was discussed today.  Time:   Today, I have spent 13 minutes with the patient with telehealth technology discussing the above problems.     Medication Adjustments/Labs and Tests Ordered: Current medicines are reviewed at length with the  patient today.  Concerns regarding medicines are outlined above.   Tests Ordered: 2D echocardiogram in 6 months and BNP  Medication Changes: No orders of the defined types were placed in this encounter.   Disposition:  Follow up in 6 month(s)  Signed, Nanetta Batty, MD  12/19/2018 4:20 PM    [facility]'

## 2018-12-19 NOTE — Patient Instructions (Signed)
Medication Instructions:  Your physician recommends that you continue on your current medications as directed. Please refer to the Current Medication list given to you today.  If you need a refill on your cardiac medications before your next appointment, please call your pharmacy.   Lab work: Your physician recommends that you return for lab work in 6 MONTHS: BRAIN NATRIURETIC PEPTIDE (BNP). YOU WILL RECEIVE A LAB SLIP IN THE MAIL. NO APPOINTMENT IS NECESSARY.  If you have labs (blood work) drawn today and your tests are completely normal, you will receive your results only by: Marland Kitchen MyChart Message (if you have MyChart) OR . A paper copy in the mail If you have any lab test that is abnormal or we need to change your treatment, we will call you to review the results.  Testing/Procedures: Your physician has requested that you have an echocardiogram. Echocardiography is a painless test that uses sound waves to create images of your heart. It provides your doctor with information about the size and shape of your heart and how well your heart's chambers and valves are working. This procedure takes approximately one hour. There are no restrictions for this procedure. LOCATION: [address], [place], [place] [address] TO BE SCHEDULED FOR A DATE 6 MONTHS FROM TODAY. YOU WILL BE CONTACTED BY OUR OFFICE TO SET UP THIS APPOINTMENT.    Follow-Up: At [facility], you and your health needs are our priority.  As part of our continuing mission to provide you with exceptional heart care, we have created designated Provider Care Teams.  These Care Teams include your primary Cardiologist (physician) and Advanced Practice Providers (APPs -  Physician Assistants and Nurse Practitioners) who all work together to provide you with the care you need, when you need it. You will need a follow up appointment in 6 months with Dr. Allyson Sabal.  Please call our office 2 months in advance to schedule this appointment.

## 2018-12-19 NOTE — Telephone Encounter (Signed)
Patient and/or DPR-approved person aware of AVS instructions and verbalized understanding. AVS released to MyChart.  

## 2019-01-14 ENCOUNTER — Telehealth (HOSPITAL_COMMUNITY): Payer: Commercial Managed Care - PPO

## 2019-01-20 NOTE — Progress Notes (Signed)
Heart Failure TeleHealth Note  Due to national recommendations of social distancing due to COVID 19, Audio/video telehealth visit is felt to be most appropriate for this patient at this time.  See MyChart message from today for patient consent [facility].  Date:  01/21/2019   ID:  Marcus Vargas, DOB 1955/12/07, MRN [id number]  Location: Home  Provider location: [facility] Type of Visit: Established patient   PCP:  Daisy Florooss, Charles Alan, MD  Cardiologist:  Dr Allyson SabalBerry  Primary HF: Dr Gala RomneyBensimhon  Chief Complaint: Heart Failure   History of Present Illness: Marcus Vargas is a 63 y.o. male with a history of  history of chronic systolic CHF, Mod CAD, HLD, and DM2.   Echo 07/30/18 LVEF 30-35%, Grade 1 DD, Mild/Mod AI, Trivial MI, Trivial TI  LHC 08/28/18  Mid RCA lesion is 70% stenosed.  Ost LAD to Prox LAD lesion is 70% stenosed.  There is severe left ventricular systolic dysfunction.  LV end diastolic pressure is moderately elevated.  The left ventricular ejection fraction is less than 25% by visual estimate.  See as a new patient in HF clinic 3/16. Cleda DaubSpiro was added and he was referred for a sleep study. Labs after med change remained stable with creatinine 1.02, K 4.3 on 3/27.  Most recent ECHO 12/12/18 showed EF improved 40-45%    He presents via video conferencing for a telehealth visit today.   Overall feeling fine. Denies SOB/PND/Orthopnea. No chest pain. His wife reports that he snores at night. Exercising 3-4 times a week with [employer] machine.  appetite ok. No fever or chills. Weight at home 163  pounds. Taking all medications.   he denies symptoms worrisome for COVID 19.   Past Medical History:  Diagnosis Date  . Diabetes mellitus without complication (HCC)   . High cholesterol    Past Surgical History:  Procedure Laterality Date  . LEFT HEART CATH AND CORONARY ANGIOGRAPHY N/A 08/28/2018   Procedure: LEFT HEART CATH  AND CORONARY ANGIOGRAPHY;  Surgeon: Runell GessBerry, Jonathan J, MD;  Location[facility];  Service: Cardiovascular;  Laterality: N/A;  . SINUS SURGERY WITH INSTATRAK       Current Outpatient Medications  Medication Sig Dispense Refill  . aspirin 81 MG chewable tablet Chew 81 mg by mouth daily.    . Azelastine HCl 0.15 % SOLN Place 2 sprays into both nostrils daily.     Marland Kitchen. CALCIUM CITRATE-VITAMIN D3 PO Take 1 tablet by mouth daily.    . carvedilol (COREG) 25 MG tablet Take 1 tablet (25 mg total) by mouth 2 (two) times daily. 180 tablet 3  . Choline Fenofibrate (FENOFIBRIC ACID) 135 MG CPDR Take 135 mg by mouth daily.     Marland Kitchen. glimepiride (AMARYL) 1 MG tablet Take 1 mg by mouth daily with breakfast.     . levocetirizine (XYZAL) 5 MG tablet Take 5 mg by mouth every evening.     . metFORMIN (GLUCOPHAGE-XR) 500 MG 24 hr tablet Take 500-1,000 mg by mouth See admin instructions. Take 500 mg by mouth in the morning and 1000 mg in the evening    . mometasone (NASONEX) 50 MCG/ACT nasal spray Place 2 sprays into the nose daily.     . Multiple Vitamin (MULTIVITAMIN WITH MINERALS) TABS tablet Take 1 tablet by mouth daily.    . Omega-3 Fatty Acids (EQL OMEGA 3 FISH OIL) 1400 MG CAPS Take 1,400 mg by mouth daily.    .Marland Kitchen  sacubitril-valsartan (ENTRESTO) 49-51 MG Take 1 tablet by mouth 2 (two) times daily. 180 tablet 3  . simvastatin (ZOCOR) 20 MG tablet Take 20 mg by mouth daily.     Marland Kitchen spironolactone (ALDACTONE) 25 MG tablet Take 0.5 tablets (12.5 mg total) by mouth daily. 45 tablet 3  . valACYclovir (VALTREX) 1000 MG tablet Take 1,000 mg by mouth 2 (two) times daily.     No current facility-administered medications for this encounter.     Allergies:   Patient has no known allergies.   Social History:  The patient  reports that he has never smoked. He has never used smokeless tobacco. He reports that he does not drink alcohol or use drugs.   Family History:  The patient's family history is not on file.    ROS:  Please see the history of present illness.   All other systems are personally reviewed and negative.  Vitals:   01/21/19 0925  BP: 121/72  Pulse: 82    Exam:  Video Health Call; Exam is visual General:  Speaks in full sentences. No resp difficulty. Lungs: Normal respiratory effort with conversation.  Abdomen: Non-distended per patient report Extremities: Small amount of edema around his ankles.  Neuro: Alert & oriented x 3.   Recent Labs: 07/09/2018: BNP 78.4 08/05/2018: NT-Pro BNP 400 08/08/2018: Hemoglobin 14.2; Platelets 242 11/14/2018: BUN 21; Creatinine, Ser 1.02; Potassium 4.3; Sodium 142  Personally reviewed   Wt Readings from Last 3 Encounters:  01/21/19 74.1 kg (163 lb 6.4 oz)  12/19/18 74.4 kg (164 lb)  11/03/18 75.6 kg (166 lb 9.6 oz)      ASSESSMENT AND PLAN:  1. Chronic systolic CHF, thought to be NICM with mild/Mod CAD - Echo 07/30/18 LVEF 30-35%, Grade 1 DD, Mild/Mod AI, Trivial MI, Trivial TI -Echo 12/12/18 LVEF 40-45%  -NYHA II . Volume status stable. Does not need diuretics.  - Continue Entresto 49/51 mg BID.  - Continue coreg to 25 mg BID.  -Increase spironolactone to 25 mg daily.  - Check BMET in 7 days.  2 Mod CAD - LHC 08/28/18 withMid RCA lesion is 70% stenosedandOst LAD to Prox LAD lesion is 70% stenosed.Medical therapy pursued.  -No chest pain. - Continue statin, ASA, and BB 3. HTN Stable.  4. Snoring Set up for home sleep study.     COVID screen The patient does not have any symptoms that suggest any further testing/ screening at this time.  Social distancing reinforced today.  Patient Risk: After full review of this patients clinical status, I feel that they are at moderate risk for cardiac decompensation at this time.  Relevant cardiac medications were reviewed at length with the patient today. The patient does not have concerns regarding their medications at this time.   The following changes were made today:  Increase  spironolactone to 25 mg daily. Check BMET in 7 days. Set up home sleep study.   Recommended follow-up: Follow up with Dr Gala Romney 3-4 months.   Today, I have spent 25 minutes with the patient with telehealth technology discussing the above issues .    Waneta Martins, NP  01/21/2019 9:34 AM  [facility] [address] [facility] [place] [address] [phone] (office) [phone] (fax)

## 2019-01-21 ENCOUNTER — Encounter (HOSPITAL_COMMUNITY): Payer: Self-pay

## 2019-01-21 ENCOUNTER — Other Ambulatory Visit: Payer: Self-pay

## 2019-01-21 ENCOUNTER — Ambulatory Visit (HOSPITAL_COMMUNITY)
Admission: RE | Admit: 2019-01-21 | Discharge: 2019-01-21 | Disposition: A | Discharge status: Home / Self Care | Payer: Commercial Managed Care - PPO | Source: Ambulatory Visit | Attending: Adult Health | Admitting: Adult Health

## 2019-01-21 VITALS — BP 121/72 | HR 82 | Wt 163.4 lb

## 2019-01-21 DIAGNOSIS — R0683 Snoring: Secondary | ICD-10-CM

## 2019-01-21 DIAGNOSIS — I10 Essential (primary) hypertension: Secondary | ICD-10-CM

## 2019-01-21 DIAGNOSIS — I5022 Chronic systolic (congestive) heart failure: Secondary | ICD-10-CM

## 2019-01-21 DIAGNOSIS — I2510 Atherosclerotic heart disease of native coronary artery without angina pectoris: Secondary | ICD-10-CM

## 2019-01-21 MED ORDER — SPIRONOLACTONE 25 MG PO TABS: 25.0000 mg | ORAL_TABLET | Freq: Every day | ORAL | 3 refills | Status: DC

## 2019-01-21 NOTE — Progress Notes (Addendum)
Patient Name: Marcus Vargas        DOB: 05-Jan-1956      Height: 82ft 8in    Weight: 163lbs 4oz  Office Name: [facility]         Referring Provider: Dr.Daniel Bensimhon  Today's Date: 01/21/2019  Date:   STOP BANG RISK ASSESSMENT S (snore) Have you been told that you snore?     YES   T (tired) Are you often tired, fatigued, or sleepy during the day?   YES  O (obstruction) Do you stop breathing, choke, or gasp during sleep? NO   P (pressure) Do you have or are you being treated for high blood pressure? YES   B (BMI) Is your body index greater than 35 kg/m? NO   A (age) Are you 63 years old or older? YES   N (neck) Do you have a neck circumference greater than 16 inches?   YES   G (gender) Are you a male? YES   TOTAL STOP/BANG "YES" ANSWERS 6                                                                       For Office Use Only              Procedure Order Form    YES to 3+ Stop Bang questions OR two clinical symptoms - patient qualifies for WatchPAT (CPT 95800)     Submit: This Form + Patient Face Sheet + Clinical Note via CloudPAT or Fax: [phone]         Clinical Notes: Will consult Sleep Specialist and refer for management of therapy due to patient increased risk of Sleep Apnea. Ordering a sleep study due to the following two clinical symptoms: Excessive daytime sleepiness G47.10 / Gastroesophageal reflux K21.9 / Nocturia R35.1 / Morning Headaches G44.221 / Difficulty concentrating R41.840 / Memory problems or poor judgment G31.84 / Personality changes or irritability R45.4 / Loud snoring R06.83 / Depression F32.9 / Unrefreshed by sleep G47.8 / Impotence N52.9 / History of high blood pressure R03.0 / Insomnia G47.00    I understand that I am proceeding with a home sleep apnea test as ordered by my treating physician. I understand that untreated sleep apnea is a serious cardiovascular risk factor and it is my responsibility to perform the test and seek  management for sleep apnea. I will be contacted with the results and be managed for sleep apnea by a local sleep physician. I will be receiving equipment and further instructions from [facility]. I shall promptly ship back the equipment via the included mailing label. I understand my insurance will be billed for the test and as the patient I am responsible for any insurance related out-of-pocket costs incurred. I have been provided with written instructions and can call for additional video or telephonic instruction, with 24-hour availability of qualified personnel to answer any questions: Patient Help Desk [phone].  6/30/2020_   Date______________________ Patient Telemedicine Verbal Consent X

## 2019-01-21 NOTE — Addendum Note (Signed)
Encounter addended by: Modesta Messing, CMA on: 01/21/2019 11:08 AM  Actions taken: Clinical Note Signed

## 2019-01-21 NOTE — Patient Instructions (Addendum)
Increase spironolactone to 25 mg daily.   Labs (BMET) in 7 days.    Follow up with Dr Gala Romney 3-4 months.     Your provider has recommended that you have a home sleep study.  [employer] is the company that provides these and will send the equipment right to your home with instructions on how to set it up.  Once you have completed the test you just dispose of the equipment, the information is automatically uploaded to [place].  If your test was positive and you need a home CPAP machine you will be contacted by Dr Norris Cross office [facility]) to set this up.

## 2019-01-21 NOTE — Addendum Note (Signed)
Encounter addended by: Modesta Messing, CMA on: 01/21/2019 10:57 AM  Actions taken: Pharmacy for encounter modified, Order list changed, Diagnosis association updated, Clinical Note Signed

## 2019-01-21 NOTE — Progress Notes (Addendum)
Increase spironolactone to 25 mg daily. Check BMET in 7 days. Set up home sleep study.   Recommended follow-up: Follow up with Dr Gala Romney 3-4 months.   ** Spoke with patient he verbalized understanding and all appts scheduled, orders/demo/office note faxed to [facility] [phone]**

## 2019-01-21 NOTE — Addendum Note (Signed)
Encounter addended by: Modesta Messing, CMA on: 01/21/2019 11:00 AM  Actions taken: Clinical Note Signed

## 2019-01-28 ENCOUNTER — Other Ambulatory Visit (HOSPITAL_COMMUNITY): Payer: Self-pay

## 2019-01-28 ENCOUNTER — Other Ambulatory Visit: Payer: Self-pay

## 2019-01-28 ENCOUNTER — Ambulatory Visit (HOSPITAL_COMMUNITY)
Admission: RE | Admit: 2019-01-28 | Discharge: 2019-01-28 | Disposition: A | Discharge status: Home / Self Care | Payer: Commercial Managed Care - PPO | Source: Ambulatory Visit | Attending: Internal Medicine | Admitting: Internal Medicine

## 2019-01-28 DIAGNOSIS — I5022 Chronic systolic (congestive) heart failure: Secondary | ICD-10-CM

## 2019-01-28 LAB — BASIC METABOLIC PANEL
Anion gap: 8 (ref 5–15)
BUN: 14 mg/dL (ref 8–23)
CO2: 26 mmol/L (ref 22–32)
Calcium: 8.9 mg/dL (ref 8.9–10.3)
Chloride: 108 mmol/L (ref 98–111)
Creatinine, Ser: 0.87 mg/dL (ref 0.61–1.24)
GFR calc Af Amer: >60 mL/min (ref >60)
GFR calc non Af Amer: >60 mL/min (ref >60)
Glucose, Bld: 126 mg/dL — ABNORMAL HIGH (ref 70–99)
Potassium: 3.7 mmol/L (ref 3.5–5.1)
Sodium: 142 mmol/L (ref 135–145)

## 2019-02-17 ENCOUNTER — Other Ambulatory Visit: Payer: Self-pay | Admitting: *Deleted

## 2019-02-17 MED ORDER — SPIRONOLACTONE 25 MG PO TABS: 25.0000 mg | ORAL_TABLET | Freq: Every day | ORAL | 3 refills | Status: DC

## 2019-03-09 NOTE — Telephone Encounter (Signed)
This encounter was created in error - please disregard.

## 2019-03-11 ENCOUNTER — Encounter: Payer: Self-pay | Admitting: Cardiovascular Disease

## 2019-03-11 ENCOUNTER — Ambulatory Visit: Payer: Commercial Managed Care - PPO | Admitting: Cardiovascular Disease

## 2019-03-11 ENCOUNTER — Other Ambulatory Visit: Payer: Self-pay

## 2019-03-11 DIAGNOSIS — E782 Mixed hyperlipidemia: Secondary | ICD-10-CM

## 2019-03-11 DIAGNOSIS — I519 Heart disease, unspecified: Secondary | ICD-10-CM

## 2019-03-11 NOTE — Patient Instructions (Signed)
Medication Instructions:  Your physician recommends that you continue on your current medications as directed. Please refer to the Current Medication list given to you today.  If you need a refill on your cardiac medications before your next appointment, please call your pharmacy.   Lab work: none If you have labs (blood work) drawn today and your tests are completely normal, you will receive your results only by: Marland Kitchen MyChart Message (if you have MyChart) OR . A paper copy in the mail If you have any lab test that is abnormal or we need to change your treatment, we will call you to review the results.  Testing/Procedures: Your physician has requested that you have an echocardiogram. Echocardiography is a painless test that uses sound waves to create images of your heart. It provides your doctor with information about the size and shape of your heart and how well your heart's chambers and valves are working. This procedure takes approximately one hour. There are no restrictions for this procedure. LOCATION: [facility]: [address], [place], [place] [address]  TO BE SCHEDULED FOR 12 MONTHS    Follow-Up: At [facility], you and your health needs are our priority.  As part of our continuing mission to provide you with exceptional heart care, we have created designated Provider Care Teams.  These Care Teams include your primary Cardiologist (physician) and Advanced Practice Providers (APPs -  Physician Assistants and Nurse Practitioners) who all work together to provide you with the care you need, when you need it. You will need a follow up appointment in 12 months WITH DR. Gwenlyn Found.  Please call our office 2 months in advance to schedule this appointment.

## 2019-03-11 NOTE — Progress Notes (Signed)
03/11/2019 Marcus Vargas   Aug 27, 1955  [id number]  Primary Physician Lawerance Cruel, MD Primary Cardiologist: Lorretta Harp MD Lupe Carney, [place]  HPI:  Marcus Vargas is a 63 y.o.  thin appearing married Caucasian male father of 4 children, grandfather one grandchild referred by Dr. Harrington Challenger for cardiovascular evaluation because of elevated proBNP. He works as a Pensions consultant at [employer].  I last saw him virtually 12/19/2018.  Cardiac risk factors are notable for true hyperlipidemia and diabetes. There is no family history of heart disease. Never had a heart attack or stroke. He denies chest pain or shortness of breath. He does get diaphoretic at times when he is exercising. Apparently for insurance physical blood work revealed an elevated proBNP and he was referred here for further evaluation.A 2D echocardiogram revealed severe LV dysfunction with an EF in the 25% range and a Myoview stress test suggested ischemia in the RCA distribution. Based on this, he underwent right and left heart cath by myself 08/28/2018 revealing moderate CAD (proximal LAD with aneurysmal dilatation, distal RCA) and severe LV dysfunction.  He was ultimately placed on heart failure pharmacology including carvedilol which was uptitrated, spironolactone and Entresto.  A recent 2D echo performed 12/11/2018 showed improvement in his EF up to 40 to 45%.  He is relatively asymptomatic.  Since I saw him 2 and half months ago he continues to do well.  He is on appropriate pharmacology.  He is asymptomatic.   The patient does not have symptoms concerning for COVID-19 infection (fever, chills, cough, or new shortness of breath).     Current Meds  Medication Sig  . aspirin 81 MG chewable tablet Chew 81 mg by mouth daily.  . Azelastine HCl 0.15 % SOLN Place 2 sprays into both nostrils daily.   Marland Kitchen CALCIUM CITRATE-VITAMIN D3 PO Take 1 tablet by mouth daily.  . carvedilol (COREG) 25 MG tablet Take 1  tablet (25 mg total) by mouth 2 (two) times daily.  . Choline Fenofibrate (FENOFIBRIC ACID) 135 MG CPDR Take 135 mg by mouth daily.   Marland Kitchen glimepiride (AMARYL) 1 MG tablet Take 1 mg by mouth daily with breakfast.   . levocetirizine (XYZAL) 5 MG tablet Take 5 mg by mouth every evening.   . metFORMIN (GLUCOPHAGE-XR) 500 MG 24 hr tablet Take 500-1,000 mg by mouth See admin instructions. Take 500 mg by mouth in the morning and 1000 mg in the evening  . mometasone (NASONEX) 50 MCG/ACT nasal spray Place 2 sprays into the nose daily.   . Multiple Vitamin (MULTIVITAMIN WITH MINERALS) TABS tablet Take 1 tablet by mouth daily.  . Omega-3 Fatty Acids (EQL OMEGA 3 FISH OIL) 1400 MG CAPS Take 1,400 mg by mouth daily.  . sacubitril-valsartan (ENTRESTO) 49-51 MG Take 1 tablet by mouth 2 (two) times daily.  . simvastatin (ZOCOR) 20 MG tablet Take 20 mg by mouth daily.   Marland Kitchen spironolactone (ALDACTONE) 25 MG tablet Take 1 tablet (25 mg total) by mouth daily.  . valACYclovir (VALTREX) 1000 MG tablet Take 1,000 mg by mouth 2 (two) times daily.     No Known Allergies  Social History   Socioeconomic History  . Marital status: Married    Spouse name: Not on file  . Number of children: Not on file  . Years of education: Not on file  . Highest education level: Not on file  Occupational History  . Not on file  Social Needs  . Financial resource strain: Not  on file  . Food insecurity    Worry: Not on file    Inability: Not on file  . Transportation needs    Medical: Not on file    Non-medical: Not on file  Tobacco Use  . Smoking status: Never Smoker  . Smokeless tobacco: Never Used  Substance and Sexual Activity  . Alcohol use: No  . Drug use: No  . Sexual activity: Not on file  Lifestyle  . Physical activity    Days per week: Not on file    Minutes per session: Not on file  . Stress: Not on file  Relationships  . Social Musicianconnections    Talks on phone: Not on file    Gets together: Not on file     Attends religious service: Not on file    Active member of club or organization: Not on file    Attends meetings of clubs or organizations: Not on file    Relationship status: Not on file  . Intimate partner violence    Fear of current or ex partner: Not on file    Emotionally abused: Not on file    Physically abused: Not on file    Forced sexual activity: Not on file  Other Topics Concern  . Not on file  Social History Narrative  . Not on file     Review of Systems: General: negative for chills, fever, night sweats or weight changes.  Cardiovascular: negative for chest pain, dyspnea on exertion, edema, orthopnea, palpitations, paroxysmal nocturnal dyspnea or shortness of breath Dermatological: negative for rash Respiratory: negative for cough or wheezing Urologic: negative for hematuria Abdominal: negative for nausea, vomiting, diarrhea, bright red blood per rectum, melena, or hematemesis Neurologic: negative for visual changes, syncope, or dizziness All other systems reviewed and are otherwise negative except as noted above.    Blood pressure 124/68, pulse 78, temperature (!) 97.5 F (36.4 C), height 5\' 8"  (1.727 m), weight 169 lb (76.7 kg).  General appearance: alert and no distress Neck: no adenopathy, no carotid bruit, no JVD, supple, symmetrical, trachea midline and thyroid not enlarged, symmetric, no tenderness/mass/nodules Lungs: clear to auscultation bilaterally Heart: regular rate and rhythm, S1, S2 normal, no murmur, click, rub or gallop Extremities: extremities normal, atraumatic, no cyanosis or edema Pulses: 2+ and symmetric Skin: Skin color, texture, turgor normal. No rashes or lesions Neurologic: Alert and oriented X 3, normal strength and tone. Normal symmetric reflexes. Normal coordination and gait  EKG sinus rhythm at 78 with nonspecific ST and T wave changes.  I personally reviewed this EKG.  ASSESSMENT AND PLAN:   Hyperlipidemia History of hyperlipidemia  fenofibrate and simvastatin with lipid profile 20 related to cholesterol 159, LDL 79 and HDL of 57  Left ventricular dysfunction History of nonischemic cardiomyopathy with cardiac catheterization from by myself moderate but noncritical CAD severe LV dysfunction and EF in the 25% range.  He was placed on optimal medical therapy including carvedilol, Entresto and spironolactone.  His EF improved by echo 12/11/2018 up to 45%.  He does not have symptoms of congestive heart failure.      Runell GessJonathan J. Berry MD FACP,FACC,FAHA, [facility] 03/11/2019 3:44 PM

## 2019-03-11 NOTE — Assessment & Plan Note (Signed)
History of hyperlipidemia fenofibrate and simvastatin with lipid profile 20 related to cholesterol 159, LDL 79 and HDL of 57

## 2019-03-11 NOTE — Assessment & Plan Note (Signed)
History of nonischemic cardiomyopathy with cardiac catheterization from by myself moderate but noncritical CAD severe LV dysfunction and EF in the 25% range.  He was placed on optimal medical therapy including carvedilol, Entresto and spironolactone.  His EF improved by echo 12/11/2018 up to 45%.  He does not have symptoms of congestive heart failure.

## 2019-04-20 ENCOUNTER — Other Ambulatory Visit (HOSPITAL_COMMUNITY): Payer: Self-pay

## 2019-04-20 MED ORDER — CARVEDILOL 25 MG PO TABS: 25.0000 mg | ORAL_TABLET | Freq: Two times a day (BID) | ORAL | 3 refills | Status: DC

## 2019-04-21 ENCOUNTER — Other Ambulatory Visit (HOSPITAL_COMMUNITY): Payer: Self-pay

## 2019-04-21 MED ORDER — CARVEDILOL 25 MG PO TABS: 25.0000 mg | ORAL_TABLET | Freq: Two times a day (BID) | ORAL | 3 refills | Status: DC

## 2019-04-23 ENCOUNTER — Encounter (HOSPITAL_COMMUNITY): Payer: Self-pay | Admitting: Internal Medicine

## 2019-04-23 ENCOUNTER — Ambulatory Visit (HOSPITAL_COMMUNITY)
Admission: RE | Admit: 2019-04-23 | Discharge: 2019-04-23 | Disposition: A | Discharge status: Home / Self Care | Payer: Commercial Managed Care - PPO | Source: Ambulatory Visit | Attending: Internal Medicine | Admitting: Internal Medicine

## 2019-04-23 ENCOUNTER — Other Ambulatory Visit: Payer: Self-pay

## 2019-04-23 VITALS — BP 118/68 | HR 80 | Wt 165.8 lb

## 2019-04-23 DIAGNOSIS — Z7984 Long term (current) use of oral hypoglycemic drugs: Secondary | ICD-10-CM | POA: Insufficient documentation

## 2019-04-23 DIAGNOSIS — E7800 Pure hypercholesterolemia, unspecified: Secondary | ICD-10-CM | POA: Insufficient documentation

## 2019-04-23 DIAGNOSIS — R0683 Snoring: Secondary | ICD-10-CM | POA: Diagnosis not present

## 2019-04-23 DIAGNOSIS — I110 Hypertensive heart disease with heart failure: Secondary | ICD-10-CM | POA: Diagnosis not present

## 2019-04-23 DIAGNOSIS — I2510 Atherosclerotic heart disease of native coronary artery without angina pectoris: Secondary | ICD-10-CM | POA: Diagnosis not present

## 2019-04-23 DIAGNOSIS — I5022 Chronic systolic (congestive) heart failure: Secondary | ICD-10-CM | POA: Insufficient documentation

## 2019-04-23 DIAGNOSIS — E119 Type 2 diabetes mellitus without complications: Secondary | ICD-10-CM | POA: Insufficient documentation

## 2019-04-23 DIAGNOSIS — I10 Essential (primary) hypertension: Secondary | ICD-10-CM

## 2019-04-23 DIAGNOSIS — Z79899 Other long term (current) drug therapy: Secondary | ICD-10-CM | POA: Diagnosis not present

## 2019-04-23 DIAGNOSIS — Z7951 Long term (current) use of inhaled steroids: Secondary | ICD-10-CM | POA: Diagnosis not present

## 2019-04-23 DIAGNOSIS — Z7982 Long term (current) use of aspirin: Secondary | ICD-10-CM | POA: Insufficient documentation

## 2019-04-23 LAB — BRAIN NATRIURETIC PEPTIDE: B Natriuretic Peptide: 33.6 pg/mL (ref 0.0–100.0)

## 2019-04-23 LAB — BASIC METABOLIC PANEL
Anion gap: 10 (ref 5–15)
BUN: 17 mg/dL (ref 8–23)
CO2: 24 mmol/L (ref 22–32)
Calcium: 9.1 mg/dL (ref 8.9–10.3)
Chloride: 107 mmol/L (ref 98–111)
Creatinine, Ser: 0.93 mg/dL (ref 0.61–1.24)
GFR calc Af Amer: >60 mL/min (ref >60)
GFR calc non Af Amer: >60 mL/min (ref >60)
Glucose, Bld: 150 mg/dL — ABNORMAL HIGH (ref 70–99)
Potassium: 4 mmol/L (ref 3.5–5.1)
Sodium: 141 mmol/L (ref 135–145)

## 2019-04-23 NOTE — Progress Notes (Signed)
[facility] Note    Date:  04/23/2019   ID:  Autry Prust, DOB 1956-02-06, MRN [id number]  Location: Home  Provider location: [place] [facility] Type of Visit: Established patient   PCP:  Lawerance Cruel, MD  Cardiologist:  Dr Gwenlyn Found  Primary HF: Dr Haroldine Laws  Chief Complaint: Heart Failure   History of Present Illness: Marcus Vargas is a 63 y.o. male with a history of  history of chronic systolic CHF, Mod CAD, HLD, and DM2.   Echo 07/30/18 LVEF 30-35%, Grade 1 DD, Mild/Mod AI, Trivial MI, Trivial TI  LHC 08/28/18  Mid RCA lesion is 70% stenosed.  Ost LAD to Prox LAD lesion is 70% stenosed.  There is severe left ventricular systolic dysfunction.  LV end diastolic pressure is moderately elevated.  The left ventricular ejection fraction is less than 25% by visual estimate.  See as a new patient in HF clinic 3/16. Arlyce Harman was added and he was referred for a sleep study. Labs after med change remained stable with creatinine 1.02, K 4.3 on 3/27.  Most recent ECHO 12/12/18 showed EF improved 40-45%  (I fel 35-40%  He presents today for routine f/u. 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 No longer exercising at [place]. Denies CP or SOB. Able to walk up 3 flights of stairs with only mildly dyspnea. No CP. Edema improved with spiro.     Past Medical History:  Diagnosis Date  . Diabetes mellitus without complication ([place])   . High cholesterol    Past Surgical History:  Procedure Laterality Date  . LEFT HEART CATH AND CORONARY ANGIOGRAPHY N/A 08/28/2018   Procedure: LEFT HEART CATH AND CORONARY ANGIOGRAPHY;  Surgeon: Lorretta Harp, MD;  Location: [place] [facility];  Service: Cardiovascular;  Laterality: N/A;  . SINUS SURGERY WITH INSTATRAK       Current Outpatient Medications  Medication Sig Dispense Refill  . aspirin 81 MG chewable tablet Chew 81 mg by mouth daily.    . Azelastine HCl 0.15 % SOLN Place 2 sprays  into both nostrils daily.     Marland Kitchen CALCIUM CITRATE-VITAMIN D3 PO Take 1 tablet by mouth daily.    . carvedilol (COREG) 25 MG tablet Take 1 tablet (25 mg total) by mouth 2 (two) times daily. 180 tablet 3  . Choline Fenofibrate (FENOFIBRIC ACID) 135 MG CPDR Take 135 mg by mouth daily.     Marland Kitchen glimepiride (AMARYL) 1 MG tablet Take 1 mg by mouth daily with breakfast.     . levocetirizine (XYZAL) 5 MG tablet Take 5 mg by mouth every evening.     . metFORMIN (GLUCOPHAGE-XR) 500 MG 24 hr tablet Take 500-1,000 mg by mouth See admin instructions. Take 500 mg by mouth in the morning and 1000 mg in the evening    . mometasone (NASONEX) 50 MCG/ACT nasal spray Place 2 sprays into the nose daily.     . Multiple Vitamin (MULTIVITAMIN WITH MINERALS) TABS tablet Take 1 tablet by mouth daily.    . Omega-3 Fatty Acids (EQL OMEGA 3 FISH OIL) 1400 MG CAPS Take 1,400 mg by mouth daily.    . sacubitril-valsartan (ENTRESTO) 49-51 MG Take 1 tablet by mouth 2 (two) times daily. 180 tablet 3  . simvastatin (ZOCOR) 20 MG tablet Take 20 mg by mouth daily.     Marland Kitchen spironolactone (ALDACTONE) 25 MG tablet Take 1 tablet (25 mg total) by mouth daily. 90 tablet 3  .  valACYclovir (VALTREX) 1000 MG tablet Take 1,000 mg by mouth 2 (two) times daily.     No current facility-administered medications for this encounter.     Allergies:   Patient has no known allergies.   Social History:  The patient  reports that he has never smoked. He has never used smokeless tobacco. He reports that he does not drink alcohol or use drugs.   Family History:  The patient's family history is not on file.   ROS:  Please see the history of present illness.   All other systems are personally reviewed and negative.  Vitals:   04/23/19 0952  BP: 118/68  Pulse: 80  SpO2: 98%    Exam:  General:  Well appearing. No resp difficulty HEENT: normal Neck: supple. no JVD. Carotids 2+ bilat; no bruits. No lymphadenopathy or thryomegaly appreciated. Cor: PMI  nondisplaced. Regular rate & rhythm. No rubs, gallops or murmurs. Lungs: clear Abdomen: soft, nontender, nondistended. No hepatosplenomegaly. No bruits or masses. Good bowel sounds. Extremities: no cyanosis, clubbing, rash, edema Neuro: alert & orientedx3, cranial nerves grossly intact. moves all 4 extremities w/o difficulty. Affect pleasant   Recent Labs: 07/09/2018: BNP 78.4 08/05/2018: NT-Pro BNP 400 08/08/2018: Hemoglobin 14.2; Platelets 242 01/28/2019: BUN 14; Creatinine, Ser 0.87; Potassium 3.7; Sodium 142  Personally reviewed   Wt Readings from Last 3 Encounters:  04/23/19 75.2 kg (165 lb 12.8 oz)  03/11/19 76.7 kg (169 lb)  01/21/19 74.1 kg (163 lb 6.4 oz)      ASSESSMENT AND PLAN:  1. Chronic systolic CHF, thought to be NICM with mild/Mod CAD - Echo 07/30/18 LVEF 30-35%, Grade 1 DD, Mild/Mod AI, Trivial MI, Trivial TI - Echo 12/12/18 LVEF 40-45% (I thought 35-40%) - NYHA II . Volume status stable. Does not need diuretics.  - Continue Entresto 49/51 mg BID. He is starting [place]Jardiance now. Will increase Entresto to full dose at next visit - Continue coreg to 25 mg BID.  - Continue spironolactone to 25 mg daily.  - Check BMET today - Check cMRI to further elucidate cause of CM 2 Mod CAD - LHC 08/28/18 withMid RCA lesion is 70% stenosedandOst LAD to Prox LAD lesion is 70% stenosed with aneurysmal dilation Medical therapy pursued. Followed by Dr. Allyson SabalBerry and lesions thought not ideal for PCI. I reviewed films with him personally today and agree lesions do not look critical.  - No s/s ischemia - Continue statin, ASA, and BB 3. HTN - Blood pressure well controlled. Continue current regimen. 4. Snoring - Has not received home sleep study yet. Will resend  5. DM2 - I agree with Dr. Tenny Crawoss and would switch glimeprimide to Jardiance  Signed, Arvilla Meresaniel Bensimhon, MD  04/23/2019 10:19 AM  [facility] [address] [facility] [place] [id number] [phone] (office) [phone] (fax)

## 2019-04-23 NOTE — Patient Instructions (Signed)
Lab work done today. We will notify you of any abnormal lab work. No news is good news!  Your physician has requested that you have a cardiac MRI. Cardiac MRI uses a computer to create images of your heart as its beating, producing both still and moving pictures of your heart and major blood vessels. For further information please visit [email or web address] Please follow the instruction sheet given to you today for more information.  Please follow up with the [facility] in 2-3 months.  At the [facility], you and your health needs are our priority. As part of our continuing mission to provide you with exceptional heart care, we have created designated Provider Care Teams. These Care Teams include your primary Cardiologist (physician) and Advanced Practice Providers (APPs- Physician Assistants and Nurse Practitioners) who all work together to provide you with the care you need, when you need it.   You may see any of the following providers on your designated Care Team at your next follow up: Marland Kitchen Dr Glori Bickers . Dr Loralie Champagne . Darrick Grinder, NP   Please be sure to bring in all your medications bottles to every appointment.

## 2019-04-28 ENCOUNTER — Telehealth (HOSPITAL_COMMUNITY): Payer: Self-pay

## 2019-04-28 NOTE — Telephone Encounter (Signed)
Pt had still not received itamar sleep study. refaxed order, stopbang, ov note and demographics. Fax confirmation received.

## 2019-06-03 ENCOUNTER — Telehealth (HOSPITAL_COMMUNITY): Payer: Self-pay | Admitting: Emergency Medicine

## 2019-06-03 NOTE — Telephone Encounter (Signed)
Reaching out to patient to offer assistance regarding upcoming cardiac imaging study; pt verbalizes understanding of appt date/time, parking situation and where to check in, pre-test NPO status and medications ordered, and verified current allergies; name and call back number provided for further questions should they arise Sara Wallace RN Navigator Cardiac Imaging [facility] [phone] office [phone] cell 

## 2019-06-04 ENCOUNTER — Ambulatory Visit (HOSPITAL_COMMUNITY)
Admission: RE | Admit: 2019-06-04 | Discharge: 2019-06-04 | Disposition: A | Discharge status: Home / Self Care | Payer: Commercial Managed Care - PPO | Source: Ambulatory Visit | Attending: Internal Medicine | Admitting: Internal Medicine

## 2019-06-04 ENCOUNTER — Other Ambulatory Visit: Payer: Self-pay

## 2019-06-04 DIAGNOSIS — I2510 Atherosclerotic heart disease of native coronary artery without angina pectoris: Secondary | ICD-10-CM | POA: Insufficient documentation

## 2019-06-04 MED ORDER — GADOBUTROL 1 MMOL/ML IV SOLN
8.0000 mL | Freq: Once | INTRAVENOUS | Status: AC | PRN
  Administered 2019-06-04: 8 mL via INTRAVENOUS

## 2019-07-09 ENCOUNTER — Telehealth (HOSPITAL_COMMUNITY): Payer: Self-pay

## 2019-07-09 NOTE — Telephone Encounter (Signed)
Called Patient to discuss his Marcus Vargas Sleep Study. He stated that he was ignoring Marcus Vargas 800 numbers. I gave him the number to [employer] and he said he would contact them today.

## 2019-07-22 ENCOUNTER — Ambulatory Visit (HOSPITAL_COMMUNITY)
Admission: RE | Admit: 2019-07-22 | Discharge: 2019-07-22 | Disposition: A | Discharge status: Home / Self Care | Payer: Commercial Managed Care - PPO | Source: Ambulatory Visit | Attending: Internal Medicine | Admitting: Internal Medicine

## 2019-07-22 ENCOUNTER — Other Ambulatory Visit: Payer: Self-pay

## 2019-07-22 DIAGNOSIS — E119 Type 2 diabetes mellitus without complications: Secondary | ICD-10-CM

## 2019-07-22 DIAGNOSIS — I10 Essential (primary) hypertension: Secondary | ICD-10-CM

## 2019-07-22 DIAGNOSIS — I5022 Chronic systolic (congestive) heart failure: Secondary | ICD-10-CM

## 2019-07-22 DIAGNOSIS — I2510 Atherosclerotic heart disease of native coronary artery without angina pectoris: Secondary | ICD-10-CM | POA: Diagnosis not present

## 2019-07-22 MED ORDER — ENTRESTO 97-103 MG PO TABS: 1.0000 | ORAL_TABLET | Freq: Two times a day (BID) | ORAL | 3 refills | Status: DC

## 2019-07-22 NOTE — Patient Instructions (Addendum)
INCREASE Entresto to 97/103 mg, one tab twice daily  Your physician recommends that you schedule a follow-up appointment in: 4 months with Dr Haroldine Laws -we will call you to schedule this appointment, if you have not heard from our office by March 2021, please give [place] a call  Do the following things EVERYDAY: 1) Weigh yourself in the morning before breakfast. Write it down and keep it in a log. 2) Take your medicines as prescribed 3) Eat low salt foods-Limit salt (sodium) to 2000 mg per day.  4) Stay as active as you can everyday 5) Limit all fluids for the day to less than 2 liters  At the [facility], you and your health needs are our priority. As part of our continuing mission to provide you with exceptional heart care, we have created designated Provider Care Teams. These Care Teams include your primary Cardiologist (physician) and Advanced Practice Providers (APPs- Physician Assistants and Nurse Practitioners) who all work together to provide you with the care you need, when you need it.   You may see any of the following providers on your designated Care Team at your next follow up: Marland Kitchen Dr Glori Bickers . Dr Loralie Champagne . Darrick Grinder, NP . Lyda Jester, PA . Audry Riles, PharmD   Please be sure to bring in all your medications bottles to every appointment.

## 2019-07-22 NOTE — Addendum Note (Signed)
Encounter addended by: Kerry Dory, CMA on: 07/22/2019 3:40 PM  Actions taken: Pharmacy for encounter modified, Order list changed, Clinical Note Signed

## 2019-07-22 NOTE — Progress Notes (Signed)
Heart Failure TeleHealth Note  Due to national recommendations Vargas social distancing due to [place] 19, Audio/video telehealth visit is felt to be most appropriate for this patient at this time.  See MyChart message from today for patient consent regarding telehealth for Marcus Vargas [place].  Date:  07/22/2019   ID:  Marcus Vargas, DOB 08-06-1956, MRN [id number]  Location: Home  Provider location: Marcus Vargas Type [facility]argas Visit: Established patient  PCP:  Lawerance Cruel, MD  Cardiologist:  Dr. Gwenlyn Found Primary HF:   Chief Complaint: Heart Failure follow-up   History [facility]as Present Illness:  Marcus Vargas is a 63 y.o. male with a history Vargas history Vargas chronic systolic CHF, Mod CAD, HLD, and DM2.  Echo 07/30/18 LVEF 30-35%, Grade 1 DD, Mild/Mod AI, Trivial MI, Trivial TI  LHC 08/28/18  Mid RCA lesion is 70% stenosed.  Ost LAD to Prox LAD lesion is 70% stenosed.  There is severe left ventricular systolic dysfunction.  LV end diastolic pressure is moderately elevated.  The left ventricular ejection fraction is less than 25% by visual estimate.  Seen as a new patient in HF [place]as 11/03/18. Marcus Vargas was added and he was referred for a sleep study. Labs after med change remained stable with creatinine 1.02, K 4.3 on 3/27.  Most recent ECHO 12/12/18 showed EF improved 40-45%  (I felt 35-40%)  cMRI 10/20 Normal left ventricular size with mild LV hypertrophy. EF 50%, the basal septum appears mildly hypokinetic. Normal right ventricular size with EF 51% (normal). Normal left and right atrial sizes. Trileaflet aortic valve with mild aortic insufficiency, no stenosis. No significant mitral regurgitation.  On delayed enhancement images, there was no definite myocardial late gadolinium enhancement (LGE).  He presents today via Engineer, civil (consulting) for a telehealth visit due to COVID-19 crisis. 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  Feels good. Will get SOB if he is walking stairs at work but otherwise fine. Minimal edema. No orthopnea or PND. No CP. Occasional leg cramps. Is pending sleep study .      Marcus Vargas denies symptoms worrisome for COVID 19.   Past Medical History:  Diagnosis Date  . Diabetes mellitus without complication ([place])   . High cholesterol    Past Surgical History:  Procedure Laterality Date  . LEFT HEART CATH AND CORONARY ANGIOGRAPHY N/A 08/28/2018   Procedure: LEFT HEART CATH AND CORONARY ANGIOGRAPHY;  Surgeon: Lorretta Harp, MD;  Location: [facility];  Service: Cardiovascular;  Laterality: N/A;  . SINUS SURGERY WITH INSTATRAK       Current Outpatient Medications  Medication Sig Dispense Refill  . aspirin 81 MG chewable tablet Chew 81 mg by mouth daily.    . Azelastine HCl 0.15 % SOLN Place 2 sprays into both nostrils daily.     Marland Kitchen CALCIUM CITRATE-VITAMIN D3 PO Take 1 tablet by mouth daily.    . carvedilol (COREG) 25 MG tablet Take 1 tablet (25 mg total) by mouth 2 (two) times daily. 180 tablet 3  . Choline Fenofibrate (FENOFIBRIC ACID) 135 MG CPDR Take 135 mg by mouth daily.     Marland Kitchen glimepiride (AMARYL) 1 MG tablet Take 1 mg by mouth daily with breakfast.     . levocetirizine (XYZAL) 5 MG tablet Take 5 mg by mouth every evening.     . metFORMIN (GLUCOPHAGE-XR) 500 MG 24 hr tablet Take 500-1,000 mg by mouth See admin instructions. Take 500 mg by mouth in the  morning and 1000 mg in the evening    . mometasone (NASONEX) 50 MCG/ACT nasal spray Place 2 sprays into the nose daily.     . Multiple Vitamin (MULTIVITAMIN WITH MINERALS) TABS tablet Take 1 tablet by mouth daily.    . Omega-3 Fatty Acids (EQL OMEGA 3 FISH OIL) 1400 MG CAPS Take 1,400 mg by mouth daily.    . sacubitril-valsartan (ENTRESTO) 49-51 MG Take 1 tablet by mouth 2 (two) times daily. 180 tablet 3  . simvastatin (ZOCOR) 20 MG tablet Take 20 mg by mouth daily.     Marland Kitchen. spironolactone (ALDACTONE) 25 MG tablet Take 1 tablet  (25 mg total) by mouth daily. 90 tablet 3  . valACYclovir (VALTREX) 1000 MG tablet Take 1,000 mg by mouth 2 (two) times daily.     No current facility-administered medications for this encounter.     Allergies:   Patient has no known allergies.   Social History:  The patient  reports that he has never smoked. He has never used smokeless tobacco. He reports that he does not drink alcohol or use drugs.   Family History:  The patient's family history is not on file.   ROS:  Please see the history Vargas present illness.   All other systems are personally reviewed and negative.   BP with his cuff: 128/72 HR 85  Exam:  (Video/Tele Health Call; Exam is subjective and or/visual.) General:  Speaks in full sentences. No resp difficulty. Lungs: Normal respiratory effort with conversation.  Abdomen: Non-distended per patient report Extremities: Pt denies edema. Neuro: Alert & oriented x 3.   Recent Labs: 08/05/2018: NT-Pro BNP 400 08/08/2018: Hemoglobin 14.2; Platelets 242 04/23/2019: B Natriuretic Peptide 33.6; BUN 17; Creatinine, Ser 0.93; Potassium 4.0; Sodium 141  Personally reviewed   Wt Readings from Last 3 Encounters:  04/23/19 75.2 kg (165 lb 12.8 oz)  03/11/19 76.7 kg (169 lb)  01/21/19 74.1 kg (163 lb 6.4 oz)      ASSESSMENT AND PLAN:  1. Chronic systolic CHF, thought to be NICM with mild/Mod CAD - Echo 07/30/18 LVEF 30-35%, Grade 1 DD, Mild/Mod AI, Trivial MI, Trivial TI - Echo 12/12/18 LVEF 40-45% (I thought 35-40%) - cMRI 10/20 EF 50% no LGE - Doing well. EF improving. NYHA II . Volume status stable. Does not need diuretics.  - Increase Entresto to 97/103 mg BID. -Continue coreg to 25 mg BID.  - Continue spironolactone to 25 mg daily.  - Continue Jardiance - Check labs - Will see back in 4 months. If HF stable can f/u with Dr. Allyson SabalBerry.   2 CAD - LHC 08/28/18 withMid RCA lesion is 70% stenosedandOst LAD to Prox LAD lesion is 70% stenosed with aneurysmal dilation Medical  therapy pursued. Followed by Dr. Allyson SabalBerry and lesions thought not ideal for PCI. I reviewed and agree lesions do not look critical.  - No s/s ischemia - Continue statin, ASA, and BB  3. HTN - Blood pressure well controlled. Continue current regimen.  4. Snoring - HAs HST ordered.   5. DM2 - Continue Jardiance  COVID screen The patient does not have any symptoms that suggest any further testing/ screening at this time.  Social distancing reinforced today.  Recommended follow-up:  As above  Relevant cardiac medications were reviewed at length with the patient today.   The patient does not have concerns regarding their medications at this time.   The following changes were made today:  As above  Today, I have spent 16 minutes  with the patient with telehealth technology discussing the above issues .    Signed, Arvilla Meres, MD  07/22/2019 3:15 PM  Advanced Heart Failure [facility] [address] [facility] [place] [address] [phone] (office) [phone] (fax)

## 2019-07-23 ENCOUNTER — Ambulatory Visit (HOSPITAL_COMMUNITY): Payer: Commercial Managed Care - PPO | Attending: Cardiovascular Disease

## 2019-07-23 DIAGNOSIS — I519 Heart disease, unspecified: Secondary | ICD-10-CM | POA: Diagnosis not present

## 2019-08-01 ENCOUNTER — Encounter (INDEPENDENT_AMBULATORY_CARE_PROVIDER_SITE_OTHER): Payer: Commercial Managed Care - PPO | Admitting: Cardiology

## 2019-08-01 DIAGNOSIS — G4733 Obstructive sleep apnea (adult) (pediatric): Secondary | ICD-10-CM | POA: Diagnosis not present

## 2019-08-13 ENCOUNTER — Ambulatory Visit: Payer: Commercial Managed Care - PPO

## 2019-08-13 DIAGNOSIS — R0683 Snoring: Secondary | ICD-10-CM

## 2019-08-13 NOTE — Procedures (Signed)
    Sleep Study Report  Patient Information Name: Marcus Vargas  ID: [id number] Birth Date: [birth year]  Age: 63  Gender: Male Insurer: BMI: 21.7 (W=143 lb, H=5' 8'') Study Date:07/20/2019 Referring Physician: Darrick Grinder, NP  Summary & Diagnosis  TEST DESCRIPTION: Home sleep apnea testing was completed using the WatchPat, a Type 1 device, utilizing peripheral arterial tonometry (PAT), chest movement, actigraphy, pulse oximetry, pulse rate, body position and snore. AHI was calculated with apnea and hypopnea using valid sleep time as the denominator. RDI includes apneas, hypopneas, and RERAs. The data acquired and the scoring of sleep and all associated events were performed in accordance with the recommended standards and specifications as outlined in the AASM Manual for the Scoring of Sleep and Associated Events 2.2.0 (2015).  FINDINGS: 1. Mild obstructive sleep apnea with AHI 9.5/hr. 2. No significant central sleep apnea with pAHIc 0.5/hr. 3. Minimum O2 saturation was 85% with 0.5 minutes spent with O2 sats < 88%. 4. Mild to moderate snoring was noted. 5. Normal sleep onset latency at 19 minutes. 6. Short REM sleep onset latency at 25 minutes. 7. There were 9 awakenings from sleep. 8. Total sleep time was 8 hours 13 minutes.  DIAGNOSIS: Mild Obstructive Sleep Apnea (G47.33)  RECOMMENDATIONS: 1. Clinical correlation of these findings is necessary. The decision to treat obstructive sleep apnea (OSA) is usually based on the presence of apnea symptoms or the presence of associated medical conditions such as Hypertension, Congestive Heart Failure, Atrial Fibrillation or Obesity. The most common symptoms of OSA are snoring, gasping for breath while sleeping, daytime sleepiness and fatigue.  2. Initiating apnea therapy is recommended given the presence of symptoms and/or associated conditions. Recommend proceeding with one of the following:   a. Auto-CPAP therapy with a pressure range  of 5-20cm H2O.   b. An oral appliance (OA) that can be obtained from certain dentists with expertise in sleep medicine. These are primarily of use in non-obese patients with mild and moderate disease.   c. An ENT consultation which may be useful to look for specific causes of obstruction and possible treatment Options.   d. If patient is intolerant to PAP therapy, consider referral to ENT for evaluation for hypoglossal nerve stimulator.  3. Close follow-up is necessary to ensure success with CPAP or oral appliance therapy for maximum benefit .  4. A follow-up oximetry study on CPAP is recommended to assess the adequacy of therapy and determine the need for supplemental oxygen or the potential need for Bi-level therapy. An arterial blood gas to determine the adequacy of baseline ventilation and oxygenation should also be considered.  5. Healthy sleep recommendations include: adequate nightly sleep (normal 7-9 hrs/night), avoidance of caffeine after noon and alcohol near bedtime, and maintaining a sleep environment that is cool, dark and quiet.  6. Weight loss for overweight patients is recommended. Even modest amounts of weight loss can significantly improve the severity of sleep apnea.  7. Snoring recommendations include: weight loss where appropriate, side sleeping, and avoidance of alcohol before Bed.  8. Operation of motor vehicle or dangerous equipment must be avoided when feeling drowsy, excessively sleepy, or mentally fatigued.  Report prepared by: Signature: Fransico Him, MD Electronically Signed: Aug 13, 2019

## 2019-08-17 ENCOUNTER — Other Ambulatory Visit: Payer: Self-pay

## 2019-08-18 ENCOUNTER — Telehealth: Payer: Self-pay | Admitting: *Deleted

## 2019-08-18 NOTE — Telephone Encounter (Signed)
-----   Message from Sueanne Margarita, MD sent at 08/13/2019  8:13 PM EST ----- Please let patient know that they have sleep apnea and recommend auto CPAP titration through [facility].  Orders have been placed in Epic. Please set 10 week OV with me.

## 2019-08-18 NOTE — Telephone Encounter (Signed)
Informed patient of sleep study results and patient understanding was verbalized. Patient understands his sleep study showed they have sleep apnea and recommend auto CPAP titration through [facility].  Please set 10 week OV with me.    DME selection is [facility]. Patient understands he will be contacted by [facility] to set up his cpap. Patient understands to call if [facility] does not contact him with new setup in a timely manner. Patient understands they will be called once confirmation has been received from [facility] that they have received their new machine to schedule 10 week follow up appointment.  [facility] notified of new cpap order  Please add to [facility] Patient was grateful for the call and thanked me. 

## 2019-08-31 ENCOUNTER — Telehealth: Payer: Self-pay | Admitting: *Deleted

## 2019-08-31 NOTE — Telephone Encounter (Signed)

## 2019-08-31 NOTE — Telephone Encounter (Signed)
Patient has a 10 week follow up appointment scheduled for 10/07/19. Patient understands he needs to keep this appointment for insurance compliance. Patient was grateful for the call and thanked me.  

## 2019-10-05 NOTE — Progress Notes (Addendum)
Virtual Visit via Telephone Note   This visit type was conducted due to national recommendations for restrictions regarding the COVID-19 Pandemic (e.g. social distancing) in an effort to limit this patient's exposure and mitigate transmission in our community.  Due to his co-morbid illnesses, this patient is at least at moderate risk for complications without adequate follow up.  This format is felt to be most appropriate for this patient at this time.  All issues noted in this document were discussed and addressed.  A limited physical exam was performed with this format.  Please refer to the patient's chart for his consent to telehealth for [facility]   Evaluation Performed:  Follow-up visit  This visit type was conducted due to national recommendations for restrictions regarding the COVID-19 Pandemic (e.g. social distancing).  This format is felt to be most appropriate for this patient at this time.  All issues noted in this document were discussed and addressed.  No physical exam was performed (except for noted visual exam findings with Video Visits).  Please refer to the patient's chart (MyChart message for video visits and phone note for telephone visits) for the patient's consent to telehealth for [facility].  Date:  10/07/2019   ID:  [name], DOB [birth year], MRN [id number]  Patient Location:  Home  Provider location:   [place]  PCP:  Quintella Reichert, MD  Cardiologist:  Arvilla Meres, MD Sleep Medicine:  Armanda Magic, MD Electrophysiologist:  None   Chief Complaint:  OSA  History of Present Illness:    Marcus Vargas is a 64 y.o. male who presents via audio/video conferencing for a telehealth visit today.    This is a 64yo male with a hx of DM2, HLD and NICM with chronic systolic CHF who was referred for HST due to snoring and CHF.  He underwent home sleep study showing mild OSA with an AHI of 9.5/hr with no significant central sleep apnea and no significant  oxygen desaturations.  There was mild to moderate snoring and reduced REM sleep onset latency.  He was started on auto CPAP and is now here for followup.   He is doing well with his CPAP device and thinks that he has gotten used to it.  He tolerates the nasal pillow mask and feels the pressure is adequate.  He has a history of sinusitis and nasal polyps and occasionally will have some nose bleeds.  He uses the nasal pillows which dry him out some.  He has adjusted the humidity to help with that. Since going on CPAP he feels rested in the am and has no significant daytime sleepiness.  He denies any significant mouth or nasal dryness or nasal congestion.  He does not think that he snores.    The patient does not have symptoms concerning for COVID-19 infection (fever, chills, cough, or new shortness of breath).   Prior CV studies:   The following studies were reviewed today:  Home sleep study, advanced HF OV notes, PAP compliance download from [facility]  Past Medical History:  Diagnosis Date  . Diabetes mellitus without complication ([facility])   . High cholesterol    Past Surgical History:  Procedure Laterality Date  . LEFT HEART CATH AND CORONARY ANGIOGRAPHY N/A 08/28/2018   Procedure: LEFT HEART CATH AND CORONARY ANGIOGRAPHY;  Surgeon: Runell Gess, MD;  Location: [facility];  Service: Cardiovascular;  Laterality: N/A;  . SINUS SURGERY WITH INSTATRAK       Current Meds  Medication  Sig  . aspirin 81 MG chewable tablet Chew 81 mg by mouth daily.  . Azelastine HCl 0.15 % SOLN Place 2 sprays into both nostrils daily.   Marland Kitchen CALCIUM CITRATE-VITAMIN D3 PO Take 1 tablet by mouth daily.  . carvedilol (COREG) 25 MG tablet Take 1 tablet (25 mg total) by mouth 2 (two) times daily.  . Choline Fenofibrate (FENOFIBRIC ACID) 135 MG CPDR Take 135 mg by mouth daily.   Marland Kitchen glimepiride (AMARYL) 1 MG tablet Take 1 mg by mouth daily with breakfast. As needed  . JARDIANCE 25 MG TABS tablet Take 25 mg by mouth  daily.  Marland Kitchen levocetirizine (XYZAL) 5 MG tablet Take 5 mg by mouth every evening.   . metFORMIN (GLUCOPHAGE-XR) 500 MG 24 hr tablet Take 500-1,000 mg by mouth See admin instructions. Take 500 mg by mouth in the morning and 1000 mg in the evening  . mometasone (NASONEX) 50 MCG/ACT nasal spray Place 2 sprays into the nose daily.   . Multiple Vitamin (MULTIVITAMIN WITH MINERALS) TABS tablet Take 1 tablet by mouth daily.  . Omega-3 Fatty Acids (EQL OMEGA 3 FISH OIL) 1400 MG CAPS Take 1,400 mg by mouth daily.  . sacubitril-valsartan (ENTRESTO) 97-103 MG Take 1 tablet by mouth 2 (two) times daily.  . simvastatin (ZOCOR) 20 MG tablet Take 20 mg by mouth daily.   Marland Kitchen spironolactone (ALDACTONE) 25 MG tablet Take 1 tablet (25 mg total) by mouth daily.  . tamsulosin (FLOMAX) 0.4 MG CAPS capsule Take 0.4 mg by mouth daily.  . valACYclovir (VALTREX) 1000 MG tablet Take 1,000 mg by mouth 2 (two) times daily.     Allergies:   Patient has no known allergies.   Social History   Tobacco Use  . Smoking status: Never Smoker  . Smokeless tobacco: Never Used  Substance Use Topics  . Alcohol use: No  . Drug use: No     Family Hx: The patient's family history is not on file.  ROS:   Please see the history of present illness.     All other systems reviewed and are negative.   Labs/Other Tests and Data Reviewed:    Recent Labs: 04/23/2019: B Natriuretic Peptide 33.6; BUN 17; Creatinine, Ser 0.93; Potassium 4.0; Sodium 141   Recent Lipid Panel No results found for: CHOL, TRIG, HDL, CHOLHDL, LDLCALC, LDLDIRECT  Wt Readings from Last 3 Encounters:  10/07/19 152 lb (68.9 kg)  04/23/19 165 lb 12.8 oz (75.2 kg)  03/11/19 169 lb (76.7 kg)     Objective:    Vital Signs:  BP 125/78   Pulse 89   Ht 5\' 8"  (1.727 m)   Wt 152 lb (68.9 kg)   BMI 23.11 kg/m     ASSESSMENT & PLAN:    1.  OSA - The pathophysiology of obstructive sleep apnea , it's cardiovascular consequences & modes of treatment including  CPAP were discused with the patient in detail & they evidenced understanding.  The patient is tolerating PAP therapy well without any problems. The PAP download was reviewed today and showed an AHI of 0.9/hr on auto PAP  with 77% compliance in using more than 4 hours nightly.  The patient has been using and benefiting from PAP use and will continue to benefit from therapy. He had questions about length of need for PAP therapy. His BMI is normal.  Since he has had some problems with chronic sinus issues and nasal polyps in the past, I have recommended that he followup with his  ENT to make sure there are no surgical causes for OSA like recurrent nasal polyps.  He will let know what his ENT says.  2.  HTN -BP controlled -continue Entresto 97-103mg  BID, Carvedilol 25mg  BID and spiro 25mg  daily -outside labs from PCP reviewed on KPN and showed Creatinine 0.93 and K+ 4 in Sept 2020  3.  DM2 -followed by PCP -HbA1C reviewed from PCP on KPN and was 7.2% this month -he remains on Glimepiride 1mg  daily and Metformin XR 500mg  daily  COVID-19 Education: The signs and symptoms of COVID-19 were discussed with the patient and how to seek care for testing (follow up with PCP or arrange E-visit).  The importance of social distancing was discussed today.  Patient Risk:   After full review of this patient's clinical status, I feel that they are at least moderate risk at this time.  Time:   Today, I have spent 20 minutes on telemedicine discussing medical problems including OSA, HTN, DM and reviewing patient's chart including home sleep study, OV notes from HF clinic and PAP compliance download from [facility].  Medication Adjustments/Labs and Tests Ordered: Current medicines are reviewed at length with the patient today.  Concerns regarding medicines are outlined above.  Tests Ordered: No orders of the defined types were placed in this encounter.  Medication Changes: No orders of the defined types were placed  in this encounter.   Disposition:  Follow up in 1 year(s)  Signed, , MD  10/07/2019 9:45 AM    [facility]

## 2019-10-07 ENCOUNTER — Telehealth (INDEPENDENT_AMBULATORY_CARE_PROVIDER_SITE_OTHER): Payer: Commercial Managed Care - PPO | Admitting: Cardiology

## 2019-10-07 ENCOUNTER — Other Ambulatory Visit: Payer: Self-pay

## 2019-10-07 ENCOUNTER — Encounter: Payer: Self-pay | Admitting: Cardiology

## 2019-10-07 VITALS — BP 125/78 | HR 89 | Ht 68.0 in | Wt 152.0 lb

## 2019-10-07 DIAGNOSIS — G4733 Obstructive sleep apnea (adult) (pediatric): Secondary | ICD-10-CM | POA: Diagnosis not present

## 2019-10-07 DIAGNOSIS — I10 Essential (primary) hypertension: Secondary | ICD-10-CM | POA: Diagnosis not present

## 2019-10-07 DIAGNOSIS — E119 Type 2 diabetes mellitus without complications: Secondary | ICD-10-CM

## 2019-10-07 NOTE — Patient Instructions (Signed)

## 2019-11-26 ENCOUNTER — Other Ambulatory Visit: Payer: Self-pay | Admitting: Adult Health

## 2020-01-11 IMAGING — MR MR CARD MORPHOLOGY WO/W CM
12 of 14 series · 38 of 40 positions shown · IV contrast (Gadavist)
Comparison: none

CLINICAL DATA: Cardiomyopathy

EXAM:
CARDIAC MRI
TECHNIQUE: The patient was scanned on a 1.5 Tesla GE magnet. A dedicated
cardiac coil was used. Functional imaging was done using Fiesta
sequences. [DATE], and 4 chamber views were done to assess for RWMA's.
Modified Ramiirez rule using a short axis stack was used to
calculate an ejection fraction on a dedicated work station using
Circle software. The patient received 8 cc of Gadavist. After 10
minutes inversion recovery sequences were used to assess for
infiltration and scar tissue.
CONTRAST:  Gadavist 8 cc

[Series 6: bSSFP · oblique · 8.0mm · 1.52mm/px · 27 of 425 slices shown (1 of 4)]
[im 1/425]
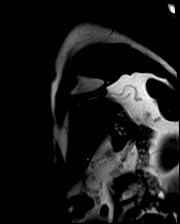
[im 17/425]
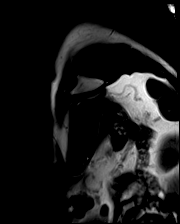
[im 33/425]
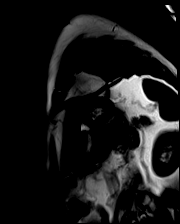
[im 49/425]
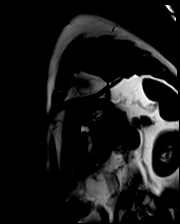
[im 66/425]
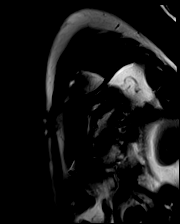
[im 82/425]
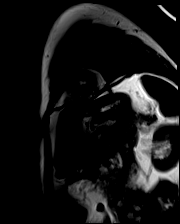
[im 98/425]
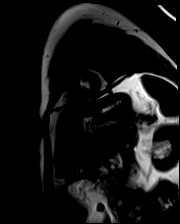
[im 115/425]
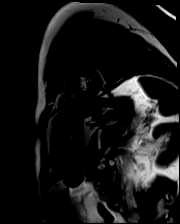
[im 131/425]
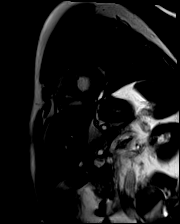
[im 147/425]
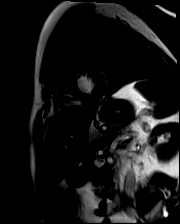
[im 164/425]
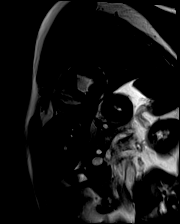
[im 180/425]
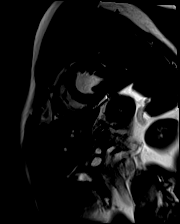
[im 196/425]
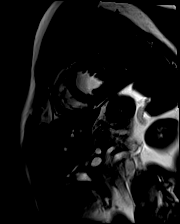
[im 213/425]
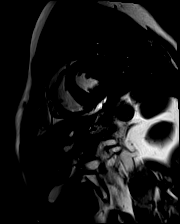
[im 229/425]
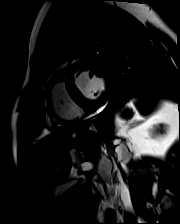
[im 245/425]
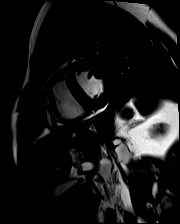
[im 261/425]
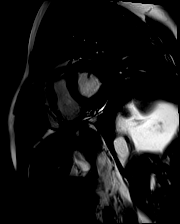
[im 278/425]
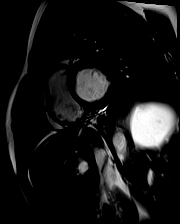
[im 294/425]
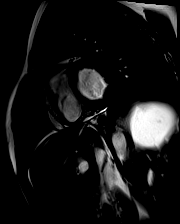
[im 310/425]
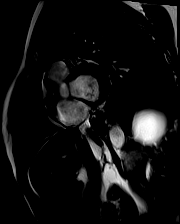
[im 327/425]
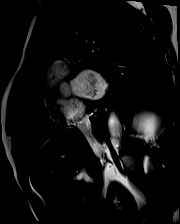
[im 343/425]
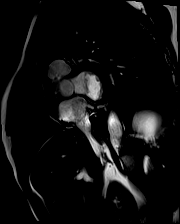
[im 359/425]
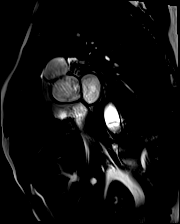
[im 376/425]
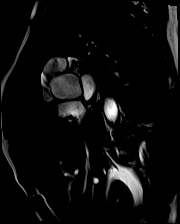
[im 392/425]
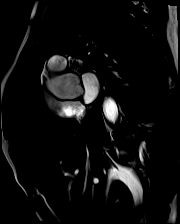
[im 408/425]
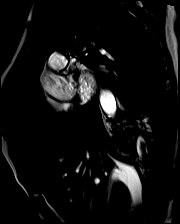
[im 425/425]
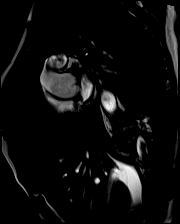

[Series 7: bSSFP · axial · 6.0mm · 1.33mm/px · 1 of 25 slices shown (2 of 4)]
[im 1/25]
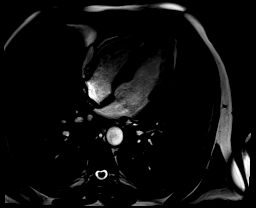

[Series 8: bSSFP · oblique · 6.0mm · 1.33mm/px · 1 of 25 slices shown (3 of 4)]
[im 1/25]
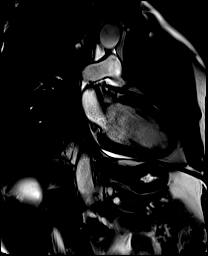

[Series 9: bSSFP · oblique · 6.0mm · 1.33mm/px · 1 of 25 slices shown (4 of 4)]
[im 1/25]
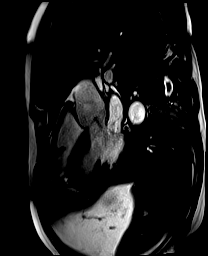

[Series 12: lge_single shot sa · oblique · 8.0mm · 1.77mm/px · 1 of 17 slices shown (1 of 2)]
[im 1/17]
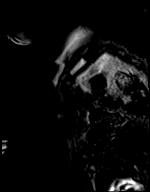

[Series 13: lge_single shot sa · oblique · 8.0mm · 1.77mm/px · 1 of 17 slices shown (2 of 2)]
[im 1/17]
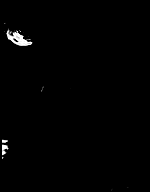

[Series 14: lge_single shot radial_mag · axial · 6.0mm · 1.77mm/px · 1 of 1 slices shown]
[im 1/1]
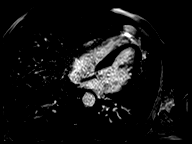

[Series 15: lge_single shot radial_psir · axial · 6.0mm · 1.77mm/px · 1 of 1 slices shown]
[im 1/1]
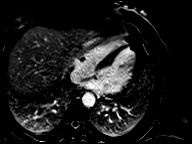

[Series 21: lge short axis_mag · oblique · 8.0mm · 1.52mm/px · 1 of 17 slices shown]
[im 1/17]
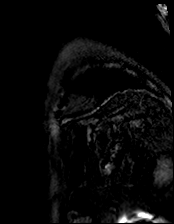

[Series 22: lge short axis_psir · oblique · 8.0mm · 1.52mm/px · 1 of 17 slices shown]
[im 1/17]
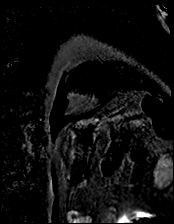

[Series 23: lge radial ((date)ch)_mag · axial · 6.0mm · 1.61mm/px · 1 of 1 slices shown]
[im 1/1]
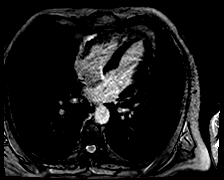

[Series 24: lge radial ((date)ch)_psir · axial · 6.0mm · 1.61mm/px · 1 of 1 slices shown]
[im 1/1]
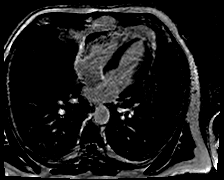

[38 of 40 positions shown; findings below may reference images not displayed]

FINDINGS: Limited images of the lung fields show no gross abnormalities.

Normal left ventricular size with mild LV hypertrophy. EF 50%, the
basal septum appears mildly hypokinetic. Normal right ventricular
size with EF 51% (normal). Normal left and right atrial sizes.
Trileaflet aortic valve with mild aortic insufficiency, no stenosis.
No significant mitral regurgitation.

On delayed enhancement images, there was no definite myocardial late
gadolinium enhancement (LGE).

Measurements:

LVEDV 156 mL

LVSV 78 mL
LVEF 50%

RVEDV 118 mL

RVSV 60 mL

RVEF 51%
IMPRESSION: 1. Normal LV size with mild LV hypertrophy. EF calculated 50%, the
basal septum appears hypokinetic.

2.  Normal RV size with EF 51% (normal).

3.  Mild aortic insufficiency.

4. No myocardial LGE noted, so no definitive evidence for prior MI,
infiltrative disease, or myocarditis.

Yong Dam Shakaj

## 2020-01-29 ENCOUNTER — Telehealth (HOSPITAL_COMMUNITY): Payer: Self-pay

## 2020-01-29 NOTE — Telephone Encounter (Signed)
Patient called and left a message on the triage vm,statin that he had some questions about his spironolactone. I called patient and he stated he wanted to know when is the best time to take the spironolactone. He stated that he had been taking it at night. I suggested to patient to take it in the morning so then it wouldn't interfere with him sleeping since spironolactone is also considered a diuretic. Patient stated he would try that and was very appreciative.

## 2020-02-08 ENCOUNTER — Other Ambulatory Visit (HOSPITAL_COMMUNITY): Payer: Self-pay | Admitting: *Deleted

## 2020-02-08 MED ORDER — CARVEDILOL 25 MG PO TABS: 25.0000 mg | ORAL_TABLET | Freq: Two times a day (BID) | ORAL | 3 refills | Status: DC

## 2020-02-19 ENCOUNTER — Telehealth: Payer: Self-pay | Admitting: Cardiovascular Disease

## 2020-02-19 NOTE — Telephone Encounter (Signed)
Patient wants to know if there is any way to tell how much he will owe for his echo on 03/24/20.

## 2020-03-24 ENCOUNTER — Ambulatory Visit (HOSPITAL_COMMUNITY): Payer: Commercial Managed Care - PPO | Attending: Internal Medicine

## 2020-03-24 ENCOUNTER — Other Ambulatory Visit: Payer: Self-pay

## 2020-03-24 DIAGNOSIS — I519 Heart disease, unspecified: Secondary | ICD-10-CM | POA: Diagnosis not present

## 2020-03-24 LAB — ECHOCARDIOGRAM COMPLETE
Area-P 1/2: 6.32 cm2
P 1/2 time: 418 msec
S' Lateral: 3.5 cm

## 2020-04-08 ENCOUNTER — Ambulatory Visit: Payer: Commercial Managed Care - PPO | Admitting: Cardiovascular Disease

## 2020-04-08 ENCOUNTER — Encounter: Payer: Self-pay | Admitting: Cardiovascular Disease

## 2020-04-08 ENCOUNTER — Other Ambulatory Visit: Payer: Self-pay

## 2020-04-08 DIAGNOSIS — I2510 Atherosclerotic heart disease of native coronary artery without angina pectoris: Secondary | ICD-10-CM | POA: Diagnosis not present

## 2020-04-08 DIAGNOSIS — I519 Heart disease, unspecified: Secondary | ICD-10-CM

## 2020-04-08 DIAGNOSIS — E782 Mixed hyperlipidemia: Secondary | ICD-10-CM

## 2020-04-08 NOTE — Assessment & Plan Note (Signed)
History of hyperlipidemia on statin therapy with lipid profile performed 03/21/2020 revealing total cholesterol 59, LDL 70 and HDL of 70.

## 2020-04-08 NOTE — Patient Instructions (Signed)
Medication Instructions:  The current medical regimen is effective;  continue present plan and medications.  *If you need a refill on your cardiac medications before your next appointment, please call your pharmacy*   Testing/Procedures: Echocardiogram (12 months) - Your physician has requested that you have an echocardiogram. Echocardiography is a painless test that uses sound waves to create images of your heart. It provides your doctor with information about the size and shape of your heart and how well your heart's chambers and valves are working. This procedure takes approximately one hour. There are no restrictions for this procedure. This will be performed at our [address] location - [address], [address].    Follow-Up: At [facility], you and your health needs are our priority.  As part of our continuing mission to provide you with exceptional heart care, we have created designated Provider Care Teams.  These Care Teams include your primary Cardiologist (physician) and Advanced Practice Providers (APPs -  Physician Assistants and Nurse Practitioners) who all work together to provide you with the care you need, when you need it.  We recommend signing up for the patient portal called "MyChart".  Sign up information is provided on this After Visit Summary.  MyChart is used to connect with patients for Virtual Visits (Telemedicine).  Patients are able to view lab/test results, encounter notes, upcoming appointments, etc.  Non-urgent messages can be sent to your provider as well.   To learn more about what you can do with MyChart, go to [email or web address].    Your next appointment:   12 month(s)  The format for your next appointment:   In Person  Provider:   Jonathan Berry, MD     

## 2020-04-08 NOTE — Assessment & Plan Note (Signed)
History of CAD with cardiac catheterization performed by myself 08/28/2018 revealed a 70% mid dominant RCA stenosis and proximal LAD stenosis.  This did not appear to be physiologically significant.  He denies chest pain.

## 2020-04-08 NOTE — Progress Notes (Signed)
04/08/2020 [name]   Sep 01, 1955  [id number]  Primary Physician Quintella Reichert, MD Primary Cardiologist: Runell Gess MD Nicholes Calamity, [place]  HPI:  Marcus Vargas is a 64 y.o.  thin appearing married Caucasian male father of 4 children, grandfather one grandchild referred by Dr. Tenny Craw for cardiovascular evaluation because of elevated proBNP. 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  Cardiac risk factors are notable for true hyperlipidemia and diabetes. There is no family history of heart disease. Never had a heart attack or stroke. He denies chest pain or shortness of breath. He does get diaphoretic at times when he is exercising. Apparently for insurance physical blood work revealed an elevated proBNP and he was referred here for further evaluation.A 2D echocardiogram revealed severe LV dysfunction with an EF in the 25% range and a Myoview stress test suggested ischemia in the RCA distribution. Based on this, heunderwent right and left heart cath by myself 08/28/2018 revealing moderate CAD (proximal LAD with aneurysmal dilatation, distal RCA) and severe LV dysfunction. He was ultimately placed on heart failure pharmacology including carvedilol which was uptitrated, spironolactone and Entresto.  A recent 2D echo performed 12/11/2018 showed improvement in his EF up to 40 to 45%. His most recent echo performed 03/24/2020 revealed EF of 52%.  He has seen Dr. Gala Romney in the advanced heart failure clinic who has assisted in optimizing his heart failure medications.  He is currently at most class I to class II and is relatively asymptomatic.  He is also seen Dr. Mayford Knife for evaluation of obstructive sleep apnea.   Current Meds  Medication Sig  . aspirin 81 MG chewable tablet Chew 81 mg by mouth daily.  . Azelastine HCl 0.15 % SOLN Place 2 sprays into both nostrils daily.   Marland Kitchen CALCIUM  CITRATE-VITAMIN D3 PO Take 1 tablet by mouth daily.  . carvedilol (COREG) 25 MG tablet Take 1 tablet (25 mg total) by mouth 2 (two) times daily.  . Choline Fenofibrate (FENOFIBRIC ACID) 135 MG CPDR Take 135 mg by mouth daily.   Marland Kitchen glimepiride (AMARYL) 1 MG tablet Take 1 mg by mouth daily with breakfast. As needed  . JARDIANCE 25 MG TABS tablet Take 25 mg by mouth daily.  Marland Kitchen levocetirizine (XYZAL) 5 MG tablet Take 5 mg by mouth every evening.   . metFORMIN (GLUCOPHAGE-XR) 500 MG 24 hr tablet Take 500-1,000 mg by mouth See admin instructions. Take 500 mg by mouth in the morning and 1000 mg in the evening  . mometasone (NASONEX) 50 MCG/ACT nasal spray Place 2 sprays into the nose daily.   . Multiple Vitamin (MULTIVITAMIN WITH MINERALS) TABS tablet Take 1 tablet by mouth daily.  . Omega-3 Fatty Acids (EQL OMEGA 3 FISH OIL) 1400 MG CAPS Take 1,400 mg by mouth daily.  . sacubitril-valsartan (ENTRESTO) 97-103 MG Take 1 tablet by mouth 2 (two) times daily.  . simvastatin (ZOCOR) 20 MG tablet Take 20 mg by mouth daily.   Marland Kitchen spironolactone (ALDACTONE) 25 MG tablet TAKE 1 TABLET DAILY (CHANGEIN DOSAGE OR TABLET SIZE)  . tamsulosin (FLOMAX) 0.4 MG CAPS capsule Take 0.4 mg by mouth daily.  . valACYclovir (VALTREX) 1000 MG tablet Take 1,000 mg by mouth 2 (two) times daily.     No Known Allergies  Social History   Socioeconomic History  . Marital status: Married    Spouse name: Not on file  . Number  of children: Not on file  . Years of education: Not on file  . Highest education level: Not on file  Occupational History  . Not on file  Tobacco Use  . Smoking status: Never Smoker  . Smokeless tobacco: Never Used  Vaping Use  . Vaping Use: Never used  Substance and Sexual Activity  . Alcohol use: No  . Drug use: No  . Sexual activity: Not on file  Other Topics Concern  . Not on file  Social History Narrative  . Not on file   Social Determinants of Health   Financial Resource Strain:   .  Difficulty of Paying Living Expenses: Not on file  Food Insecurity:   . Worried About Programme researcher, broadcasting/film/video in the Last Year: Not on file  . Ran Out of Food in the Last Year: Not on file  Transportation Needs:   . Lack of Transportation (Medical): Not on file  . Lack of Transportation (Non-Medical): Not on file  Physical Activity:   . Days of Exercise per Week: Not on file  . Minutes of Exercise per Session: Not on file  Stress:   . Feeling of Stress : Not on file  Social Connections:   . Frequency of Communication with Friends and Family: Not on file  . Frequency of Social Gatherings with Friends and Family: Not on file  . Attends Religious Services: Not on file  . Active Member of Clubs or Organizations: Not on file  . Attends Banker Meetings: Not on file  . Marital Status: Not on file  Intimate Partner Violence:   . Fear of Current or Ex-Partner: Not on file  . Emotionally Abused: Not on file  . Physically Abused: Not on file  . Sexually Abused: Not on file     Review of Systems: General: negative for chills, fever, night sweats or weight changes.  Cardiovascular: negative for chest pain, dyspnea on exertion, edema, orthopnea, palpitations, paroxysmal nocturnal dyspnea or shortness of breath Dermatological: negative for rash Respiratory: negative for cough or wheezing Urologic: negative for hematuria Abdominal: negative for nausea, vomiting, diarrhea, bright red blood per rectum, melena, or hematemesis Neurologic: negative for visual changes, syncope, or dizziness All other systems reviewed and are otherwise negative except as noted above.    Blood pressure 108/62, pulse 77, height 5' 7.5" (1.715 m), weight 160 lb (72.6 kg), SpO2 98 %.  General appearance: alert and no distress Neck: no adenopathy, no carotid bruit, no JVD, supple, symmetrical, trachea midline and thyroid not enlarged, symmetric, no tenderness/mass/nodules Lungs: clear to auscultation  bilaterally Heart: regular rate and rhythm, S1, S2 normal, no murmur, click, rub or gallop Extremities: extremities normal, atraumatic, no cyanosis or edema Pulses: 2+ and symmetric Skin: Skin color, texture, turgor normal. No rashes or lesions Neurologic: Alert and oriented X 3, normal strength and tone. Normal symmetric reflexes. Normal coordination and gait  EKG sinus rhythm at 77 without ST or T wave changes.  I personally reviewed this EKG.  ASSESSMENT AND PLAN:   Hyperlipidemia History of hyperlipidemia on statin therapy with lipid profile performed 03/21/2020 revealing total cholesterol 59, LDL 70 and HDL of 70.  Left ventricular dysfunction History of nonischemic cardiomyopathy with a recent 2D echo performed 03/24/2020 revealed an EF of 52% without valvular abnormalities.  His EF by echo a year ago was 45% and in the past has been as low as 25%.  He has seen Dr. Gala Romney in the advanced heart failure clinic and is  on optimal guideline directed medical therapy.  He is otherwise asymptomatic.  Coronary artery disease History of CAD with cardiac catheterization performed by myself 08/28/2018 revealed a 70% mid dominant RCA stenosis and proximal LAD stenosis.  This did not appear to be physiologically significant.  He denies chest pain.      Runell Gess MD FACP,FACC,FAHA, [facility] 04/08/2020 9:41 AM

## 2020-04-08 NOTE — Assessment & Plan Note (Signed)
History of nonischemic cardiomyopathy with a recent 2D echo performed 03/24/2020 revealed an EF of 52% without valvular abnormalities.  His EF by echo a year ago was 45% and in the past has been as low as 25%.  He has seen Dr. Gala Romney in the advanced heart failure clinic and is on optimal guideline directed medical therapy.  He is otherwise asymptomatic.

## 2020-07-04 ENCOUNTER — Other Ambulatory Visit (HOSPITAL_COMMUNITY): Payer: Self-pay | Admitting: Internal Medicine

## 2020-09-09 ENCOUNTER — Telehealth (HOSPITAL_COMMUNITY): Payer: Self-pay | Admitting: Internal Medicine

## 2020-09-09 ENCOUNTER — Other Ambulatory Visit: Payer: Self-pay | Admitting: Internal Medicine

## 2020-09-09 NOTE — Telephone Encounter (Signed)
Pt request spironolactone refill, please send script to [facility]  Mail service, appt scheduled 02/28 w/DM

## 2020-09-14 ENCOUNTER — Other Ambulatory Visit (HOSPITAL_COMMUNITY): Payer: Self-pay | Admitting: Internal Medicine

## 2020-09-14 ENCOUNTER — Telehealth (HOSPITAL_COMMUNITY): Payer: Self-pay | Admitting: Pharmacist

## 2020-09-14 MED ORDER — ENTRESTO 97-103 MG PO TABS: 1.0000 | ORAL_TABLET | Freq: Two times a day (BID) | ORAL | 1 refills | Status: DC

## 2020-09-14 NOTE — Telephone Encounter (Signed)
Refill of Entresto sent to [facility] per patient request.   Karle Plumber, PharmD, BCPS, BCCP, CPP Heart Failure Clinic Pharmacist [phone]

## 2020-10-16 NOTE — Progress Notes (Addendum)
Advanced Heart Failure Clinic Note    Date:  10/17/2020   ID:  Marcus Vargas, DOB 1956-02-29, MRN [id number]  Location: Home  Provider location: [place] [facility] Type of Visit: Established patient   PCP:  Quintella Reichert, MD  Cardiologist:  Dr Allyson Sabal  Primary HF: Dr Gala Romney  Chief Complaint: Heart Failure   History of Present Illness:  Marcus Vargas is a 64 y.o. male with a history of  history of chronic systolic CHF, Mod CAD, HLD, and DM2.   Echo 07/30/18 LVEF 30-35%, Grade 1 DD, Mild/Mod AI, Trivial MI, Trivial TI  LHC 08/28/18  Mid RCA lesion is 70% stenosed.  Ost LAD to Prox LAD lesion is 70% stenosed.  There is severe left ventricular systolic dysfunction.  LV end diastolic pressure is moderately elevated.  The left ventricular ejection fraction is less than 25% by visual estimate.  See as a new patient in HF clinic 3/16. Cleda Daub was added and he was referred for a sleep study. Labs after med change remained stable with creatinine 1.02, K 4.3 on 3/27.  ECHO 12/12/18 showed EF improved 40-45%  (I felt 35-40%)  Echo 8/21 EF 52% RV normal. Mild AI. Asc aorta 4.1 cm. Personally reviewed  cMRI 10/20 - LVEF 50%. Mild LVH. RVEF 51% No LGE.  He presents today for routine f/u. 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 Feels good. Not using his Bowflex much as he had a fall on the ice last month and hurt his ribs. No CP, edema, orthopnea or PND.     Past Medical History:  Diagnosis Date  . CHF (congestive heart failure) (HCC)   . Diabetes mellitus without complication (HCC)   . High cholesterol    Past Surgical History:  Procedure Laterality Date  . LEFT HEART CATH AND CORONARY ANGIOGRAPHY N/A 08/28/2018   Procedure: LEFT HEART CATH AND CORONARY ANGIOGRAPHY;  Surgeon: Runell Gess, MD;  [facility];  Service: Cardiovascular;  Laterality: N/A;  . SINUS SURGERY WITH INSTATRAK       Current Outpatient  Medications  Medication Sig Dispense Refill  . APPLE CIDER VINEGAR PO Take 1 capsule by mouth daily.    Marland Kitchen aspirin 81 MG chewable tablet Chew 81 mg by mouth daily.    . Azelastine HCl 0.15 % SOLN Place 2 sprays into both nostrils daily.     Marland Kitchen CALCIUM CITRATE-VITAMIN D3 PO Take 1 tablet by mouth daily.    . carvedilol (COREG) 25 MG tablet TAKE 1 TABLET TWICE A DAY  (NOTE DOSE CHANGE) 180 tablet 3  . Choline Fenofibrate (FENOFIBRIC ACID) 135 MG CPDR Take 135 mg by mouth daily.     Marland Kitchen CINNAMON PO Take 2 capsules by mouth daily.    Marland Kitchen glimepiride (AMARYL) 1 MG tablet Take 1 mg by mouth daily with breakfast. As needed    . JARDIANCE 25 MG TABS tablet Take 25 mg by mouth daily.    Marland Kitchen levocetirizine (XYZAL) 5 MG tablet Take 5 mg by mouth every evening.     . metFORMIN (GLUCOPHAGE-XR) 500 MG 24 hr tablet Take 500-1,000 mg by mouth See admin instructions. Take 500 mg by mouth in the morning and 1000 mg in the evening    . mometasone (NASONEX) 50 MCG/ACT nasal spray Place 2 sprays into the nose daily.     . Multiple Vitamin (MULTIVITAMIN WITH MINERALS) TABS tablet Take 1 tablet by mouth daily.    Marland Kitchen  Omega-3 Fatty Acids (EQL OMEGA 3 FISH OIL) 1400 MG CAPS Take 1,400 mg by mouth daily.    . sacubitril-valsartan (ENTRESTO) 97-103 MG Take 1 tablet by mouth 2 (two) times daily. 180 tablet 1  . simvastatin (ZOCOR) 20 MG tablet Take 20 mg by mouth daily.     Marland Kitchen spironolactone (ALDACTONE) 25 MG tablet TAKE 1 TABLET DAILY (CHANGEIN DOSAGE OR TABLET SIZE) 90 tablet 3  . tamsulosin (FLOMAX) 0.4 MG CAPS capsule Take 0.4 mg by mouth daily.    . valACYclovir (VALTREX) 1000 MG tablet Take 1,000 mg by mouth as needed.     No current facility-administered medications for this encounter.    Allergies:   Patient has no known allergies.   Social History:  The patient  reports that he has never smoked. He has never used smokeless tobacco. He reports that he does not drink alcohol and does not use drugs.   Family History:   The patient's family history is not on file.   ROS:  Please see the history of present illness.   All other systems are personally reviewed and negative.  Vitals:   10/17/20 1434  BP: 118/60  Pulse: 86  SpO2: 95%    Exam:  General:  Well appearing. No resp difficulty HEENT: normal Neck: supple. no JVD. Carotids 2+ bilat; no bruits. No lymphadenopathy or thryomegaly appreciated. Cor: PMI nondisplaced. Regular rate & rhythm. No rubs, gallops or murmurs. Lungs: clear Abdomen: soft, nontender, nondistended. No hepatosplenomegaly. No bruits or masses. Good bowel sounds. Extremities: no cyanosis, clubbing, rash, edema Neuro: alert & orientedx3, cranial nerves grossly intact. moves all 4 extremities w/o difficulty. Affect pleasant  Recent Labs: No results found for requested labs within last 8760 hours.  Personally reviewed   Wt Readings from Last 3 Encounters:  10/17/20 71.1 kg (156 lb 12.8 oz)  10/17/20 68.9 kg (152 lb)  04/08/20 72.6 kg (160 lb)      ASSESSMENT AND PLAN:  1. Chronic systolic CHF, thought to be NICM with mild/Mod CAD - Echo 07/30/18 LVEF 30-35%, Grade 1 DD, Mild/Mod AI, Trivial MI, Trivial TI - Echo 12/12/18 LVEF 40-45% (I thought 35-40%) - cMRI 10/20 EF 50% no LGE - Stable NYHA I-II. Volume status looks good.  - Continue Entresto 49/51 mg BID.  - Continue Jardiance 10 - Continue coreg 25 mg BID.  - Continue spironolactone to 25 mg daily.   2 CAD, moderate - LHC 08/28/18 withMid RCA lesion is 70% stenosedandOst LAD to Prox LAD lesion is 70% stenosed with aneurysmal dilation Medical therapy pursued. Followed by Dr. Allyson Sabal and lesions thought not ideal for PCI. - cath films reviewed personally with him today.  - No s/s ischemia - Continue statin, ASA, and BB-> needs aggressive RF reduction and lipid management with LDL < 70.   3. HTN - Blood pressure well controlled. Continue current regimen.  4. 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  - repeat echo 1 year  5. OSA - On CPAP. Follows with Dr. Mayford Knife  5. DM2 - Continue Jardaince   EF has normalized. NYHA I-II. Continue current meds. Can graduate from HF Clinic and f/u with Dr. Allyson Sabal.  Signed, Arvilla Meres, MD  10/17/2020 2:43 PM  [facility] [address] [facility] [place] [address] [phone] (office) [phone] (fax)

## 2020-10-16 NOTE — Progress Notes (Signed)
Virtual Visit via Video Note   This visit type was conducted due to national recommendations for restrictions regarding the COVID-19 Pandemic (e.g. social distancing) in an effort to limit this patient's exposure and mitigate transmission in our community.  Due to his co-morbid illnesses, this patient is at least at moderate risk for complications without adequate follow up.  This format is felt to be most appropriate for this patient at this time.  All issues noted in this document were discussed and addressed.  A limited physical exam was performed with this format.  Please refer to the patient's chart for his consent to telehealth for [facility].   Evaluation Performed:  Follow-up visit  This visit type was conducted due to national recommendations for restrictions regarding the COVID-19 Pandemic (e.g. social distancing).  This format is felt to be most appropriate for this patient at this time.  All issues noted in this document were discussed and addressed.  No physical exam was performed (except for noted visual exam findings with Video Visits).  Please refer to the patient's chart (MyChart message for video visits and phone note for telephone visits) for the patient's consent to telehealth for [facility].  Date:  10/17/2020   ID:  [name], DOB 13-Sep-1955, MRN [id number]  Patient Location:  Home  Provider location:   [place]  PCP:  Quintella Reichert, MD  Cardiologist:  Arvilla Meres, MD Sleep Medicine:  Armanda Magic, MD Electrophysiologist:  None   Chief Complaint:  OSA  History of Present Illness:    Marcus Vargas is a 65 y.o. male with a hx of DM2, HLD and NICM with chronic systolic CHF who was referred for HST due to snoring and CHF.  He underwent home sleep study showing mild OSA with an AHI of 9.5/hr with no significant central sleep apnea and no significant oxygen desaturations.  There was mild to moderate snoring and reduced REM sleep onset latency.  He was  started on auto CPAP and is now here for followup.   He is doing well with his CPAP device and thinks that he has gotten used to it.  He tolerates the mask and feels the pressure is adequate.  Since going on CPAP he feels rested in the am and has no significant daytime sleepiness if he has slept well the night before.  He complains of dry mouth but does not wear a chin strap.  He denies nasal dryness but occasionally has problems with nasal congestion.  He does not think that he snores.    The patient does not have symptoms concerning for COVID-19 infection (fever, chills, cough, or new shortness of breath).   Prior CV studies:   The following studies were reviewed today:  PAP compliance download from [facility]  Past Medical History:  Diagnosis Date  . Diabetes mellitus without complication (HCC)   . High cholesterol    Past Surgical History:  Procedure Laterality Date  . LEFT HEART CATH AND CORONARY ANGIOGRAPHY N/A 08/28/2018   Procedure: LEFT HEART CATH AND CORONARY ANGIOGRAPHY;  Surgeon: Runell Gess, MD;  Location: [facility];  Service: Cardiovascular;  Laterality: N/A;  . SINUS SURGERY WITH INSTATRAK       Current Meds  Medication Sig  . aspirin 81 MG chewable tablet Chew 81 mg by mouth daily.  . Azelastine HCl 0.15 % SOLN Place 2 sprays into both nostrils daily.   Marland Kitchen CALCIUM CITRATE-VITAMIN D3 PO Take 1 tablet by mouth daily.  Marland Kitchen  carvedilol (COREG) 25 MG tablet TAKE 1 TABLET TWICE A DAY  (NOTE DOSE CHANGE)  . Choline Fenofibrate (FENOFIBRIC ACID) 135 MG CPDR Take 135 mg by mouth daily.   Marland Kitchen glimepiride (AMARYL) 1 MG tablet Take 1 mg by mouth daily with breakfast. As needed  . JARDIANCE 25 MG TABS tablet Take 25 mg by mouth daily.  Marland Kitchen levocetirizine (XYZAL) 5 MG tablet Take 5 mg by mouth every evening.   . metFORMIN (GLUCOPHAGE-XR) 500 MG 24 hr tablet Take 500-1,000 mg by mouth See admin instructions. Take 500 mg by mouth in the morning and 1000 mg in the evening  .  mometasone (NASONEX) 50 MCG/ACT nasal spray Place 2 sprays into the nose daily.   . Multiple Vitamin (MULTIVITAMIN WITH MINERALS) TABS tablet Take 1 tablet by mouth daily.  . Omega-3 Fatty Acids (EQL OMEGA 3 FISH OIL) 1400 MG CAPS Take 1,400 mg by mouth daily.  . sacubitril-valsartan (ENTRESTO) 97-103 MG Take 1 tablet by mouth 2 (two) times daily.  . simvastatin (ZOCOR) 20 MG tablet Take 20 mg by mouth daily.   Marland Kitchen spironolactone (ALDACTONE) 25 MG tablet TAKE 1 TABLET DAILY (CHANGEIN DOSAGE OR TABLET SIZE)  . tamsulosin (FLOMAX) 0.4 MG CAPS capsule Take 0.4 mg by mouth daily.  . valACYclovir (VALTREX) 1000 MG tablet Take 1,000 mg by mouth 2 (two) times daily.     Allergies:   Patient has no known allergies.   Social History   Tobacco Use  . Smoking status: Never Smoker  . Smokeless tobacco: Never Used  Vaping Use  . Vaping Use: Never used  Substance Use Topics  . Alcohol use: No  . Drug use: No     Family Hx: The patient's family history is not on file.  ROS:   Please see the history of present illness.     All other systems reviewed and are negative.   Labs/Other Tests and Data Reviewed:    Recent Labs: No results found for requested labs within last 8760 hours.   Recent Lipid Panel No results found for: CHOL, TRIG, HDL, CHOLHDL, LDLCALC, LDLDIRECT  Wt Readings from Last 3 Encounters:  10/17/20 152 lb (68.9 kg)  04/08/20 160 lb (72.6 kg)  10/07/19 152 lb (68.9 kg)     Objective:    Vital Signs:  Ht 5' 7.5" (1.715 m)   Wt 152 lb (68.9 kg)   BMI 23.46 kg/m    Well nourished, well developed male in no acute distress. Well appearing, alert and conversant, regular work of breathing,  good skin color  Eyes- anicteric mouth- oral mucosa is pink  neuro- grossly intact skin- no apparent rash or lesions or cyanosis   ASSESSMENT & PLAN:    1.  OSA -  The patient is tolerating PAP therapy well without any problems. The PAP download was reviewed today and showed an  AHI of 1.2/hr on auto PAP  with 73% compliance in using more than 4 hours nightly.  The patient has been using and benefiting from PAP use and will continue to benefit from therapy.  -I will add a chin strap to help with dry mouth -he is having problems with the nasal pillow mask fitting well so I am going to get him a local DME with [facility] to see what his mask options are  2.  HTN -continue Entresto 97-103mg  BID, Carvedilol 25mg  BID and spiro 25mg  daily  COVID-19 Education: The signs and symptoms of COVID-19 were discussed with the patient and  how to seek care for testing (follow up with PCP or arrange E-visit).  The importance of social distancing was discussed today.  Patient Risk:   After full review of this patient's clinical status, I feel that they are at least moderate risk at this time.  Time:   Today, I have spent 20 minutes on telemedicine discussing medical problems including OSA, HTN, and reviewing PAP compliance download from [facility].  Medication Adjustments/Labs and Tests Ordered: Current medicines are reviewed at length with the patient today.  Concerns regarding medicines are outlined above.  Tests Ordered: No orders of the defined types were placed in this encounter.  Medication Changes: No orders of the defined types were placed in this encounter.   Disposition:  Follow up in 1 year(s)  Signed, Armanda Magic, MD  10/17/2020 9:11 AM    [facility]

## 2020-10-17 ENCOUNTER — Ambulatory Visit (HOSPITAL_COMMUNITY)
Admission: RE | Admit: 2020-10-17 | Discharge: 2020-10-17 | Disposition: A | Discharge status: Home / Self Care | Payer: Commercial Managed Care - PPO | Source: Ambulatory Visit | Attending: Internal Medicine | Admitting: Internal Medicine

## 2020-10-17 ENCOUNTER — Telehealth (INDEPENDENT_AMBULATORY_CARE_PROVIDER_SITE_OTHER): Payer: Commercial Managed Care - PPO | Admitting: Cardiology

## 2020-10-17 ENCOUNTER — Encounter: Payer: Self-pay | Admitting: Cardiology

## 2020-10-17 ENCOUNTER — Encounter (HOSPITAL_COMMUNITY): Payer: Self-pay | Admitting: Internal Medicine

## 2020-10-17 ENCOUNTER — Other Ambulatory Visit: Payer: Self-pay

## 2020-10-17 VITALS — Ht 67.5 in | Wt 152.0 lb

## 2020-10-17 VITALS — BP 118/60 | HR 86 | Wt 156.8 lb

## 2020-10-17 DIAGNOSIS — Z7982 Long term (current) use of aspirin: Secondary | ICD-10-CM | POA: Diagnosis not present

## 2020-10-17 DIAGNOSIS — G4733 Obstructive sleep apnea (adult) (pediatric): Secondary | ICD-10-CM

## 2020-10-17 DIAGNOSIS — Z7951 Long term (current) use of inhaled steroids: Secondary | ICD-10-CM | POA: Diagnosis not present

## 2020-10-17 DIAGNOSIS — E119 Type 2 diabetes mellitus without complications: Secondary | ICD-10-CM | POA: Diagnosis not present

## 2020-10-17 DIAGNOSIS — I10 Essential (primary) hypertension: Secondary | ICD-10-CM

## 2020-10-17 DIAGNOSIS — Z79899 Other long term (current) drug therapy: Secondary | ICD-10-CM | POA: Diagnosis not present

## 2020-10-17 DIAGNOSIS — I2510 Atherosclerotic heart disease of native coronary artery without angina pectoris: Secondary | ICD-10-CM | POA: Diagnosis not present

## 2020-10-17 DIAGNOSIS — E7800 Pure hypercholesterolemia, unspecified: Secondary | ICD-10-CM | POA: Diagnosis not present

## 2020-10-17 DIAGNOSIS — E785 Hyperlipidemia, unspecified: Secondary | ICD-10-CM | POA: Insufficient documentation

## 2020-10-17 DIAGNOSIS — I110 Hypertensive heart disease with heart failure: Secondary | ICD-10-CM | POA: Diagnosis not present

## 2020-10-17 DIAGNOSIS — Z7984 Long term (current) use of oral hypoglycemic drugs: Secondary | ICD-10-CM | POA: Insufficient documentation

## 2020-10-17 DIAGNOSIS — I5022 Chronic systolic (congestive) heart failure: Secondary | ICD-10-CM | POA: Diagnosis not present

## 2020-10-17 HISTORY — DX: Heart failure, unspecified: I50.9

## 2020-10-17 NOTE — Patient Instructions (Addendum)
You have officially graduated from the [facility]. Please call office at [phone] option [phone] if you have any questions or concerns.    Your physician recommends that you schedule a follow-up appointment in: 3-4 months with Dr Hazle Coca office.  Call his office to make your appointment.    At the [facility], you and your health needs are our priority. As part of our continuing mission to provide you with exceptional heart care, we have created designated Provider Care Teams. These Care Teams include your primary Cardiologist (physician) and Advanced Practice Providers (APPs- Physician Assistants and Nurse Practitioners) who all work together to provide you with the care you need, when you need it.   You may see any of the following providers on your designated Care Team at your next follow up: Marland Kitchen Dr Arvilla Meres . Dr Marca Ancona . Dr Thornell Mule . Tonye Becket, NP . Robbie Lis, PA . Shanda Bumps [place],NP . Karle Plumber, PharmD   Please be sure to bring in all your medications bottles to every appointment.  ;

## 2020-10-17 NOTE — Patient Instructions (Signed)

## 2020-10-24 ENCOUNTER — Telehealth: Payer: Self-pay | Admitting: *Deleted

## 2020-10-24 NOTE — Telephone Encounter (Signed)
-----   Message from Quintella Reichert, MD sent at 10/17/2020  9:14 AM EST ----- Please add a chin strap and transfer them from [facility] to [facility] and get him an in person OV with [facility] as he is having problems with his mask

## 2020-10-24 NOTE — Telephone Encounter (Signed)
Chin strap ordered through [facility]. OV ordered for mask problems.

## 2020-12-13 ENCOUNTER — Ambulatory Visit (INDEPENDENT_AMBULATORY_CARE_PROVIDER_SITE_OTHER): Payer: Medicare Other | Admitting: Cardiovascular Disease

## 2020-12-13 ENCOUNTER — Other Ambulatory Visit: Payer: Self-pay

## 2020-12-13 ENCOUNTER — Encounter: Payer: Self-pay | Admitting: Cardiovascular Disease

## 2020-12-13 VITALS — BP 121/61 | HR 84 | Ht 68.0 in | Wt 151.2 lb

## 2020-12-13 DIAGNOSIS — E782 Mixed hyperlipidemia: Secondary | ICD-10-CM | POA: Diagnosis not present

## 2020-12-13 DIAGNOSIS — I519 Heart disease, unspecified: Secondary | ICD-10-CM

## 2020-12-13 DIAGNOSIS — I2510 Atherosclerotic heart disease of native coronary artery without angina pectoris: Secondary | ICD-10-CM

## 2020-12-13 DIAGNOSIS — I5022 Chronic systolic (congestive) heart failure: Secondary | ICD-10-CM | POA: Diagnosis not present

## 2020-12-13 NOTE — Assessment & Plan Note (Signed)
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  He denies chest pain.

## 2020-12-13 NOTE — Assessment & Plan Note (Signed)
History of hyperlipidemia on statin therapy with lipid profile performed 03/21/2020 revealing total cholesterol of 159, LDL of 70 and HDL 70.

## 2020-12-13 NOTE — Assessment & Plan Note (Addendum)
History of nonischemic cardiomyopathy with an EF of 25% at the time of cath 2 years ago which is increased to 52% on guideline directed optimal medical therapy with the assistance of Dr. Gala Romney in the advanced heart failure clinic.  He is asymptomatic.

## 2020-12-13 NOTE — Patient Instructions (Signed)
Medication Instructions:  Your physician recommends that you continue on your current medications as directed. Please refer to the Current Medication list given to you today.  *If you need a refill on your cardiac medications before your next appointment, please call your pharmacy*   Testing/Procedures: Your physician has requested that you have an echocardiogram. Echocardiography is a painless test that uses sound waves to create images of your heart. It provides your doctor with information about the size and shape of your heart and how well your heart's chambers and valves are working. This procedure takes approximately one hour. There are no restrictions for this procedure. This procedure is done at [address]. To be done in August 2022.   Follow-Up: At [facility], you and your health needs are our priority.  As part of our continuing mission to provide you with exceptional heart care, we have created designated Provider Care Teams.  These Care Teams include your primary Cardiologist (physician) and Advanced Practice Providers (APPs -  Physician Assistants and Nurse Practitioners) who all work together to provide you with the care you need, when you need it.  We recommend signing up for the patient portal called "MyChart".  Sign up information is provided on this After Visit Summary.  MyChart is used to connect with patients for Virtual Visits (Telemedicine).  Patients are able to view lab/test results, encounter notes, upcoming appointments, etc.  Non-urgent messages can be sent to your provider as well.   To learn more about what you can do with MyChart, go to [email or web address].    Your next appointment:   12 month(s)  The format for your next appointment:   In Person  Provider:   Nanetta Batty, MD

## 2020-12-13 NOTE — Progress Notes (Signed)
12/13/2020 Marcus Vargas   Jan 12, 1956  [id number]  Primary Physician Daisy Floro, MD Primary Cardiologist: Runell Gess MD Marcus Vargas, [place]  HPI:  [name] is a 65 y.o.  thin appearing married Caucasian male father of 4 children, grandfather one grandchild referred by Dr. Tenny Craw for cardiovascular evaluation because of elevated proBNP. 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 Cardiac risk factors are notable for true hyperlipidemia and diabetes. There is no family history of heart disease. Never had a heart attack or stroke. He denies chest pain or shortness of breath. He does get diaphoretic at times when he is exercising. Apparently for insurance physical blood work revealed an elevated proBNP and he was referred here for further evaluation.A 2D echocardiogram revealed severe LV dysfunction with an EF in the 25% range and a Myoview stress test suggested ischemia in the RCA distribution. Based on this, heunderwent right and left heart cath by myself 08/28/2018 revealing moderate CAD (proximal LAD with aneurysmal dilatation, distal RCA) and severe LV dysfunction. He was ultimately placed on heart failure pharmacology including carvedilol which was uptitrated, spironolactone and Entresto.  Arecent 2D echo performed 12/11/2018 showed improvement in his EF up to 40 to 45%. His most recent echo performed 03/24/2020 revealed EF of 52%.  He has seen Dr. Gala Romney in the advanced heart failure clinic who has assisted in optimizing his heart failure medications.  He is currently at most class I to class II and is relatively asymptomatic.  He is also seen Dr. Mayford Knife for evaluation of obstructive sleep apnea.  Since I saw him 8 months ago he continues to do well.  He has been released from Dr. Prescott Gum advanced heart failure practice because of clinical improvement.  His EF is in the low  50% range, low normal as result of guideline directed optimal medical therapy.   Current Meds  Medication Sig  . APPLE CIDER VINEGAR PO Take 1 capsule by mouth daily.  Marland Kitchen aspirin 81 MG chewable tablet Chew 81 mg by mouth daily.  . Azelastine HCl 0.15 % SOLN Place 2 sprays into both nostrils daily.   Marland Kitchen CALCIUM CITRATE-VITAMIN D3 PO Take 1 tablet by mouth daily.  . carvedilol (COREG) 25 MG tablet TAKE 1 TABLET TWICE A DAY  (NOTE DOSE CHANGE)  . Choline Fenofibrate (FENOFIBRIC ACID) 135 MG CPDR Take 135 mg by mouth daily.   Marland Kitchen CINNAMON PO Take 2 capsules by mouth daily.  Marland Kitchen glimepiride (AMARYL) 1 MG tablet Take 1 mg by mouth daily with breakfast. As needed  . JARDIANCE 25 MG TABS tablet Take 25 mg by mouth daily.  Marland Kitchen levocetirizine (XYZAL) 5 MG tablet Take 5 mg by mouth every evening.   . metFORMIN (GLUCOPHAGE-XR) 500 MG 24 hr tablet Take 500-1,000 mg by mouth See admin instructions. Take 500 mg by mouth in the morning and 1000 mg in the evening  . mometasone (NASONEX) 50 MCG/ACT nasal spray Place 2 sprays into the nose daily.   . Multiple Vitamin (MULTIVITAMIN WITH MINERALS) TABS tablet Take 1 tablet by mouth daily.  . Omega-3 Fatty Acids (EQL OMEGA 3 FISH OIL) 1400 MG CAPS Take 1,400 mg by mouth daily.  . sacubitril-valsartan (ENTRESTO) 97-103 MG Take 1 tablet by mouth 2 (two) times daily.  . simvastatin (ZOCOR) 20 MG tablet Take 20 mg by mouth daily.   Marland Kitchen spironolactone (ALDACTONE) 25 MG tablet TAKE 1 TABLET  DAILY (CHANGEIN DOSAGE OR TABLET SIZE)  . tamsulosin (FLOMAX) 0.4 MG CAPS capsule Take 0.4 mg by mouth daily.  . valACYclovir (VALTREX) 1000 MG tablet Take 1,000 mg by mouth as needed.     No Known Allergies  Social History   Socioeconomic History  . Marital status: Married    Spouse name: Not on file  . Number of children: Not on file  . Years of education: Not on file  . Highest education level: Not on file  Occupational History  . Not on file  Tobacco Use  . Smoking status:  Never Smoker  . Smokeless tobacco: Never Used  Vaping Use  . Vaping Use: Never used  Substance and Sexual Activity  . Alcohol use: No  . Drug use: No  . Sexual activity: Not on file  Other Topics Concern  . Not on file  Social History Narrative  . Not on file   Social Determinants of Health   Financial Resource Strain: Not on file  Food Insecurity: Not on file  Transportation Needs: Not on file  Physical Activity: Not on file  Stress: Not on file  Social Connections: Not on file  Intimate Partner Violence: Not on file     Review of Systems: General: negative for chills, fever, night sweats or weight changes.  Cardiovascular: negative for chest pain, dyspnea on exertion, edema, orthopnea, palpitations, paroxysmal nocturnal dyspnea or shortness of breath Dermatological: negative for rash Respiratory: negative for cough or wheezing Urologic: negative for hematuria Abdominal: negative for nausea, vomiting, diarrhea, bright red blood per rectum, melena, or hematemesis Neurologic: negative for visual changes, syncope, or dizziness All other systems reviewed and are otherwise negative except as noted above.    Blood pressure 121/61, pulse 84, height 5\' 8"  (1.727 m), weight 151 lb 3.2 oz (68.6 kg), SpO2 97 %.  General appearance: alert and no distress Neck: no adenopathy, no carotid bruit, no JVD, supple, symmetrical, trachea midline and thyroid not enlarged, symmetric, no tenderness/mass/nodules Lungs: clear to auscultation bilaterally Heart: regular rate and rhythm, S1, S2 normal, no murmur, click, rub or gallop Extremities: extremities normal, atraumatic, no cyanosis or edema Pulses: 2+ and symmetric Skin: Skin color, texture, turgor normal. No rashes or lesions Neurologic: Alert and oriented X 3, normal strength and tone. Normal symmetric reflexes. Normal coordination and gait  EKG sinus rhythm at 84 without ST or T wave changes.  Personally reviewed this EKG.  ASSESSMENT  AND PLAN:   Hyperlipidemia History of hyperlipidemia on statin therapy with lipid profile performed 03/21/2020 revealing total cholesterol of 159, LDL of 70 and HDL 70.  Left ventricular dysfunction History of nonischemic cardiomyopathy with an EF of 25% at the time of cath 2 years ago which is increased to 52% on guideline directed optimal medical therapy with the assistance of Dr. 05/21/2020 in the advanced heart failure clinic.  He is asymptomatic.  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  He denies chest pain.      10/27/2018 MD FACP,FACC,FAHA, [facility] 12/13/2020 9:42 AM

## 2021-01-17 ENCOUNTER — Other Ambulatory Visit (HOSPITAL_COMMUNITY): Payer: Self-pay | Admitting: Internal Medicine

## 2021-04-07 ENCOUNTER — Other Ambulatory Visit (HOSPITAL_COMMUNITY): Payer: Commercial Managed Care - PPO

## 2021-04-11 ENCOUNTER — Ambulatory Visit (HOSPITAL_COMMUNITY): Payer: Medicare Other | Attending: Cardiology

## 2021-04-11 ENCOUNTER — Other Ambulatory Visit: Payer: Self-pay

## 2021-04-11 DIAGNOSIS — I519 Heart disease, unspecified: Secondary | ICD-10-CM | POA: Diagnosis present

## 2021-04-11 DIAGNOSIS — I5022 Chronic systolic (congestive) heart failure: Secondary | ICD-10-CM | POA: Diagnosis present

## 2021-04-11 DIAGNOSIS — E782 Mixed hyperlipidemia: Secondary | ICD-10-CM | POA: Insufficient documentation

## 2021-04-11 LAB — ECHOCARDIOGRAM COMPLETE
Area-P 1/2: 3.03 cm2
Calc EF: 51 %
P 1/2 time: 566 msec
S' Lateral: 3.7 cm
Single Plane A2C EF: 50.5 %
Single Plane A4C EF: 52.7 %

## 2021-10-02 ENCOUNTER — Other Ambulatory Visit: Payer: Self-pay | Admitting: Internal Medicine

## 2021-12-08 ENCOUNTER — Ambulatory Visit (INDEPENDENT_AMBULATORY_CARE_PROVIDER_SITE_OTHER): Payer: Medicare Other | Admitting: Cardiovascular Disease

## 2021-12-08 ENCOUNTER — Encounter: Payer: Self-pay | Admitting: Cardiovascular Disease

## 2021-12-08 VITALS — BP 104/58 | HR 80 | Ht 68.0 in | Wt 157.2 lb

## 2021-12-08 DIAGNOSIS — E782 Mixed hyperlipidemia: Secondary | ICD-10-CM | POA: Diagnosis not present

## 2021-12-08 DIAGNOSIS — I2510 Atherosclerotic heart disease of native coronary artery without angina pectoris: Secondary | ICD-10-CM | POA: Diagnosis not present

## 2021-12-08 DIAGNOSIS — I519 Heart disease, unspecified: Secondary | ICD-10-CM

## 2021-12-08 DIAGNOSIS — I5022 Chronic systolic (congestive) heart failure: Secondary | ICD-10-CM

## 2021-12-08 MED ORDER — ROSUVASTATIN CALCIUM 40 MG PO TABS: 40.0000 mg | ORAL_TABLET | Freq: Every day | ORAL | 3 refills | Status: DC

## 2021-12-08 MED ORDER — ROSUVASTATIN CALCIUM 40 MG PO TABS: 40.0000 mg | ORAL_TABLET | Freq: Every day | ORAL | 3 refills | Status: AC

## 2021-12-08 MED ORDER — CARVEDILOL 25 MG PO TABS: ORAL_TABLET | ORAL | 3 refills | Status: DC

## 2021-12-08 MED ORDER — SPIRONOLACTONE 25 MG PO TABS: ORAL_TABLET | ORAL | 3 refills | Status: DC

## 2021-12-08 MED ORDER — ENTRESTO 97-103 MG PO TABS: 1.0000 | ORAL_TABLET | Freq: Two times a day (BID) | ORAL | 3 refills | Status: DC

## 2021-12-08 NOTE — Progress Notes (Signed)
? ? ? ?12/08/2021 ?Marcus Vargas   ?[birth year]  ?[id number] ? ?Primary Physician Lawerance Cruel, MD ?Primary Cardiologist: Lorretta Harp MD Lupe Carney, [place] ? ?HPI:  Marcus Vargas is a 66 y.o.  thin appearing married Caucasian male father of 4 children, grandfather one grandchild referred by Dr. Harrington Challenger for cardiovascular evaluation because of elevated proBNP.  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  I last saw him virtually 12/13/2020.  Cardiac risk factors are notable for true hyperlipidemia and diabetes.  There is no family history of heart disease.  Never had a heart attack or stroke.  He denies chest pain or shortness of breath.  He does get diaphoretic at times when he is exercising.  Apparently for insurance physical blood work revealed an elevated proBNP and he was referred here for further evaluation.  A 2D echocardiogram revealed severe LV dysfunction with an EF in the 25% range and a Myoview stress test suggested ischemia in the RCA distribution.  Based on this, he underwent right and left heart cath by myself 08/28/2018 revealing moderate CAD (proximal LAD with aneurysmal dilatation, distal RCA) and severe LV dysfunction.  He was ultimately placed on heart failure pharmacology including carvedilol which was uptitrated, spironolactone and Entresto. ?  ?A recent 2D echo performed 12/11/2018 showed improvement in his EF up to 40 to 45%.  His most recent echo performed 03/24/2020 revealed EF of 52%.  He has seen Dr. Haroldine Laws in the advanced heart failure clinic who has assisted in optimizing his heart failure medications.  He is currently at most class I to class II and is relatively asymptomatic.  He is also seen Dr. Radford Pax for evaluation of obstructive sleep apnea. ?  ?He has been released from Dr. Clayborne Dana advanced heart failure practice because of clinical improvement.  His EF is in the low 50% range, low normal as result of guideline directed  optimal medical therapy. ?  ?Since I saw him a year ago he continues to do well.  His lipid medications were adjusted by Dr. Harrington Challenger, his PCP.  His LDL still remains elevated at 102.  His EF is in the 55% range by 2D echo August 2022.  He is completely asymptomatic. ? ? ?Current Meds  ?Medication Sig  ? aspirin 81 MG chewable tablet Chew 81 mg by mouth daily.  ? Azelastine HCl 0.15 % SOLN Place 2 sprays into both nostrils daily.   ? CALCIUM CITRATE-VITAMIN D3 PO Take 1 tablet by mouth daily.  ? CINNAMON PO Take 2 capsules by mouth daily.  ? glimepiride (AMARYL) 1 MG tablet Take 1 mg by mouth daily with breakfast. As needed  ? JARDIANCE 25 MG TABS tablet Take 25 mg by mouth daily.  ? levocetirizine (XYZAL) 5 MG tablet Take 5 mg by mouth every evening.   ? metFORMIN (GLUCOPHAGE-XR) 500 MG 24 hr tablet Take 500-1,000 mg by mouth See admin instructions. Take 500 mg by mouth in the morning and 1000 mg in the evening  ? mometasone (NASONEX) 50 MCG/ACT nasal spray Place 2 sprays into the nose daily.   ? Multiple Vitamin (MULTIVITAMIN WITH MINERALS) TABS tablet Take 1 tablet by mouth daily.  ? Omega-3 Fatty Acids (EQL OMEGA 3 FISH OIL) 1400 MG CAPS Take 1,400 mg by mouth daily.  ? tamsulosin (FLOMAX) 0.4 MG CAPS capsule Take 0.4 mg by mouth daily.  ? valACYclovir (VALTREX) 1000 MG tablet Take 1,000 mg by mouth as needed.  ? [  DISCONTINUED] carvedilol (COREG) 25 MG tablet TAKE 1 TABLET TWICE A DAY  (NOTE DOSE CHANGE)  ? [DISCONTINUED] ENTRESTO 97-103 MG TAKE 1 TABLET TWICE A DAY  ? [DISCONTINUED] rosuvastatin (CRESTOR) 20 MG tablet Take 20 mg by mouth at bedtime.  ? [DISCONTINUED] spironolactone (ALDACTONE) 25 MG tablet TAKE 1 TABLET DAILY (CHANGEIN DOSAGE OR TABLET SIZE)  ?  ? ?No Known Allergies ? ?Social History  ? ?Socioeconomic History  ? Marital status: Married  ?  Spouse name: Not on file  ? Number of children: Not on file  ? Years of education: Not on file  ? Highest education level: Not on file  ?Occupational History  ?  Not on file  ?Tobacco Use  ? Smoking status: Never  ? Smokeless tobacco: Never  ?Vaping Use  ? Vaping Use: Never used  ?Substance and Sexual Activity  ? Alcohol use: No  ? Drug use: No  ? Sexual activity: Not on file  ?Other Topics Concern  ? Not on file  ?Social History Narrative  ? Not on file  ? ?Social Determinants of Health  ? ?Financial Resource Strain: Not on file  ?Food Insecurity: Not on file  ?Transportation Needs: Not on file  ?Physical Activity: Not on file  ?Stress: Not on file  ?Social Connections: Not on file  ?Intimate Partner Violence: Not on file  ?  ? ?Review of Systems: ?General: negative for chills, fever, night sweats or weight changes.  ?Cardiovascular: negative for chest pain, dyspnea on exertion, edema, orthopnea, palpitations, paroxysmal nocturnal dyspnea or shortness of breath ?Dermatological: negative for rash ?Respiratory: negative for cough or wheezing ?Urologic: negative for hematuria ?Abdominal: negative for nausea, vomiting, diarrhea, bright red blood per rectum, melena, or hematemesis ?Neurologic: negative for visual changes, syncope, or dizziness ?All other systems reviewed and are otherwise negative except as noted above. ? ? ? ?Blood pressure (!) 104/58, pulse 80, height 5\' 8"  (1.727 m), weight 157 lb 3.2 oz (71.3 kg), SpO2 98 %.  ?General appearance: alert and no distress ?Neck: no adenopathy, no carotid bruit, no JVD, supple, symmetrical, trachea midline, and thyroid not enlarged, symmetric, no tenderness/mass/nodules ?Lungs: clear to auscultation bilaterally ?Heart: regular rate and rhythm, S1, S2 normal, no murmur, click, rub or gallop ?Extremities: extremities normal, atraumatic, no cyanosis or edema ?Pulses: 2+ and symmetric ?Skin: Skin color, texture, turgor normal. No rashes or lesions ?Neurologic: Grossly normal ? ?EKG sinus rhythm at 80 without ST or T wave changes.  I personally reviewed this EKG. ? ?ASSESSMENT AND PLAN:  ? ?Hyperlipidemia ?History of hyperlipidemia on  Crestor 20 mg a day with recent lipid profile performed 10/25/2021 revealing total cholesterol 190, LDL 102 and HDL 58.  He does have moderate CAD.  He is not at goal for secondary prevention.  I am going to increase his Crestor from 20 to 40 mg a day and we will recheck a lipid liver profile in 3 months. ? ?Left ventricular dysfunction ?History of nonischemic cardiomyopathy with an EF initially of 25% by 2D echo.  After being treated with guideline directed optimal medical therapy is most recent echo performed 04/11/2021 revealed normalization of his LV function up to 50 to 55%.  He has no symptoms of heart failure. ? ?Coronary artery disease ?History of moderate CAD by right left heart cath which I performed 08/28/2018.  He had moderate proximal LAD disease with aneurysmal dilatation and distal RCA disease.  I elected to treat this medically given his lack of symptoms. ? ? ? ? ?  Lorretta Harp MD FACP,FACC,FAHA, FSCAI ?12/08/2021 ?4:36 PM ?

## 2021-12-08 NOTE — Patient Instructions (Signed)
Medication Instructions:  ? ?-Increase rosuvastatin (crestor) to 40mg  once daily. ? ?*If you need a refill on your cardiac medications before your next appointment, please call your pharmacy* ? ? ?Lab Work: ?Your physician recommends that you return for lab work in: 3 months for FASTING lipid/liver profile ? ?If you have labs (blood work) drawn today and your tests are completely normal, you will receive your results only by: ?MyChart Message (if you have MyChart) OR ?A paper copy in the mail ?If you have any lab test that is abnormal or we need to change your treatment, we will call you to review the results. ? ? ?Follow-Up: ?At [facility], you and your health needs are our priority.  As part of our continuing mission to provide you with exceptional heart care, we have created designated Provider Care Teams.  These Care Teams include your primary Cardiologist (physician) and Advanced Practice Providers (APPs -  Physician Assistants and Nurse Practitioners) who all work together to provide you with the care you need, when you need it. ? ?We recommend signing up for the patient portal called "MyChart".  Sign up information is provided on this After Visit Summary.  MyChart is used to connect with patients for Virtual Visits (Telemedicine).  Patients are able to view lab/test results, encounter notes, upcoming appointments, etc.  Non-urgent messages can be sent to your provider as well.   ?To learn more about what you can do with MyChart, go to [facility].   ? ?Your next appointment:   ?12 month(s) ? ?The format for your next appointment:   ?In Person ? ?Provider:   ?[email or web address], MD ? ?

## 2021-12-08 NOTE — Assessment & Plan Note (Signed)
History of hyperlipidemia on Crestor 20 mg a day with recent lipid profile performed 10/25/2021 revealing total cholesterol 190, LDL 102 and HDL 58.  He does have moderate CAD.  He is not at goal for secondary prevention.  I am going to increase his Crestor from 20 to 40 mg a day and we will recheck a lipid liver profile in 3 months. ?

## 2021-12-08 NOTE — Assessment & Plan Note (Signed)
History of nonischemic cardiomyopathy with an EF initially of 25% by 2D echo.  After being treated with guideline directed optimal medical therapy is most recent echo performed 04/11/2021 revealed normalization of his LV function up to 50 to 55%.  He has no symptoms of heart failure. ?

## 2021-12-08 NOTE — Assessment & Plan Note (Signed)
History of moderate CAD by right left heart cath which I performed 08/28/2018.  He had moderate proximal LAD disease with aneurysmal dilatation and distal RCA disease.  I elected to treat this medically given his lack of symptoms. ?

## 2021-12-21 ENCOUNTER — Telehealth: Payer: Self-pay | Admitting: Cardiology

## 2021-12-21 NOTE — Telephone Encounter (Signed)
Patient wanted to know if Dr. Mayford Knife still wants him to continue using his CPAP machine. The patient has graduated from the Advanced Heart Failure Clinic and no longer sees Dr. Gala Romney. Please let the patient know what Dr. Mayford Knife recommends  ?

## 2021-12-22 ENCOUNTER — Telehealth: Payer: Self-pay | Admitting: Cardiovascular Disease

## 2021-12-22 NOTE — Telephone Encounter (Signed)
Contacted patient, LVM- made aware of message from MD.  ?Left call back number if questions/concerns. ? ? ?

## 2021-12-22 NOTE — Telephone Encounter (Signed)
Left message for patient to call back  

## 2021-12-22 NOTE — Telephone Encounter (Signed)
Pt is returning call.  

## 2021-12-22 NOTE — Telephone Encounter (Signed)
Patient wanted Dr Kennon Holter advice as to whether or not he needs to continue using his CPAP ?

## 2022-03-01 ENCOUNTER — Other Ambulatory Visit (HOSPITAL_COMMUNITY): Payer: Self-pay | Admitting: Internal Medicine

## 2022-03-10 LAB — HEPATIC FUNCTION PANEL
ALT: 24 IU/L (ref 0–44)
AST: 18 IU/L (ref 0–40)
Albumin: 4.4 g/dL (ref 3.9–4.9)
Alkaline Phosphatase: 73 IU/L (ref 44–121)
Bilirubin Total: 0.2 mg/dL (ref 0.0–1.2)
Bilirubin, Direct: <0.1 mg/dL (ref 0.00–0.40)
Total Protein: 6.4 g/dL (ref 6.0–8.5)

## 2022-03-10 LAB — LIPID PANEL
Chol/HDL Ratio: 2.7 ratio (ref 0.0–5.0)
Cholesterol, Total: 160 mg/dL (ref 100–199)
HDL: 59 mg/dL (ref >39)
LDL Chol Calc (NIH): 71 mg/dL (ref 0–99)
Triglycerides: 182 mg/dL — ABNORMAL HIGH (ref 0–149)
VLDL Cholesterol Cal: 30 mg/dL (ref 5–40)

## 2022-06-04 ENCOUNTER — Telehealth: Payer: Self-pay | Admitting: Cardiology

## 2022-06-04 NOTE — Telephone Encounter (Signed)
Calling in bout his cpap supplies and need his results for his [employer]. Please advise

## 2022-06-04 NOTE — Telephone Encounter (Signed)
Pt is returning call. Transferred to Sydell Axon, [place].

## 2022-06-04 NOTE — Telephone Encounter (Signed)
Call was completed. Patient received the paperwork he needed.

## 2022-06-25 ENCOUNTER — Telehealth: Payer: Self-pay | Admitting: *Deleted

## 2022-06-25 DIAGNOSIS — G4733 Obstructive sleep apnea (adult) (pediatric): Secondary | ICD-10-CM

## 2022-06-25 DIAGNOSIS — I5022 Chronic systolic (congestive) heart failure: Secondary | ICD-10-CM

## 2022-06-25 DIAGNOSIS — I10 Essential (primary) hypertension: Secondary | ICD-10-CM

## 2022-07-06 NOTE — Telephone Encounter (Signed)
Upon patient request DME selection is [facility] Patient understands he will be contacted by [facility] to set up his cpap. Patient understands to call if [facility] does not contact him with new setup in a timely manner. Patient understands they will be called once confirmation has been received from [facility] that they have received their new machine to schedule 10 week follow up appointment.   [facility] notified of new cpap order  Please add to airview Patient was grateful for the call and thanked me.   Cpap supplies sent by [employer].

## 2022-07-17 ENCOUNTER — Telehealth: Payer: Self-pay | Admitting: Cardiology

## 2022-07-17 NOTE — Telephone Encounter (Signed)
Patient stated [facility] is his new sleep equipment provider and they are requesting a note regarding his supplies.

## 2022-07-19 ENCOUNTER — Telehealth: Payer: Self-pay

## 2022-07-19 ENCOUNTER — Encounter: Payer: Self-pay | Admitting: Cardiology

## 2022-07-19 ENCOUNTER — Ambulatory Visit: Payer: Medicare Other | Attending: Cardiology | Admitting: Cardiology

## 2022-07-19 VITALS — Ht 67.5 in | Wt 149.0 lb

## 2022-07-19 DIAGNOSIS — G4733 Obstructive sleep apnea (adult) (pediatric): Secondary | ICD-10-CM | POA: Diagnosis present

## 2022-07-19 DIAGNOSIS — I10 Essential (primary) hypertension: Secondary | ICD-10-CM | POA: Diagnosis present

## 2022-07-19 DIAGNOSIS — I2510 Atherosclerotic heart disease of native coronary artery without angina pectoris: Secondary | ICD-10-CM

## 2022-07-19 NOTE — Progress Notes (Signed)
Virtual Visit via Video Note   This visit type was conducted due to national recommendations for restrictions regarding the COVID-19 Pandemic (e.g. social distancing) in an effort to limit this patient's exposure and mitigate transmission in our community.  Due to his co-morbid illnesses, this patient is at least at moderate risk for complications without adequate follow up.  This format is felt to be most appropriate for this patient at this time.  All issues noted in this document were discussed and addressed.  A limited physical exam was performed with this format.  Please refer to the patient's chart for his consent to telehealth for [facility].   Evaluation Performed:  Follow-up visit   Date:  07/19/2022   ID:  Marcus Vargas, DOB 12/31/1955, MRN [id number]  Patient Location:  Home  Provider location:   [place]  PCP:  Daisy Floro, MD  Cardiologist:  Arvilla Meres, MD Sleep Medicine:  Armanda Magic, MD Electrophysiologist:  None   Chief Complaint:  OSA  History of Present Illness:    Marcus Vargas is a 66 y.o. male with a hx of DM2, HLD and NICM with chronic systolic CHF who was referred for HST due to snoring and CHF.  He underwent home sleep study showing mild OSA with an AHI of 9.5/hr with no significant central sleep apnea and no significant oxygen desaturations.  There was mild to moderate snoring and reduced REM sleep onset latency.  He was started on auto CPAP and is now here for followup.   He is doing well with his PAP device and thinks that he has gotten used to it.  He tolerates the mask but feels the pressure is adequate.  Since going on PAP he feels rested in the am and has no significant daytime sleepiness.  He denies any significant mouth or nasal dryness or nasal congestion.  e does not think that he snores.    Prior CV studies:   The following studies were reviewed today:  PAP compliance download from [facility]  Past Medical History:  Diagnosis  Date   CHF (congestive heart failure) (HCC)    Diabetes mellitus without complication (HCC)    High cholesterol    OSA on CPAP    Past Surgical History:  Procedure Laterality Date   LEFT HEART CATH AND CORONARY ANGIOGRAPHY N/A 08/28/2018   Procedure: LEFT HEART CATH AND CORONARY ANGIOGRAPHY;  Surgeon: Runell Gess, MD;  Location: [facility];  Service: Cardiovascular;  Laterality: N/A;   SINUS SURGERY WITH INSTATRAK       Current Meds  Medication Sig   aspirin 81 MG chewable tablet Chew 81 mg by mouth daily.   Azelastine HCl 0.15 % SOLN Place 2 sprays into both nostrils daily.    CALCIUM CITRATE-VITAMIN D3 PO Take 1 tablet by mouth daily.   carvedilol (COREG) 25 MG tablet TAKE 1 TABLET TWICE A DAY  (NOTE DOSE CHANGE)   CINNAMON PO Take 2 capsules by mouth daily.   ENTRESTO 97-103 MG TAKE 1 TABLET TWICE A DAY   glimepiride (AMARYL) 1 MG tablet Take 1 mg by mouth daily with breakfast. As needed   JARDIANCE 25 MG TABS tablet Take 25 mg by mouth daily.   levocetirizine (XYZAL) 5 MG tablet Take 5 mg by mouth every evening.    mometasone (NASONEX) 50 MCG/ACT nasal spray Place 2 sprays into the nose daily.    Multiple Vitamin (MULTIVITAMIN WITH MINERALS) TABS tablet Take 1 tablet by mouth daily.  Omega-3 Fatty Acids (EQL OMEGA 3 FISH OIL) 1400 MG CAPS Take 1,400 mg by mouth daily.   OZEMPIC, 0.25 OR 0.5 MG/DOSE, 2 MG/3ML SOPN Inject 0.5 mg into the skin once a week.   rosuvastatin (CRESTOR) 40 MG tablet Take 1 tablet (40 mg total) by mouth at bedtime.   spironolactone (ALDACTONE) 25 MG tablet TAKE 1 TABLET DAILY (CHANGEIN DOSAGE OR TABLET SIZE)   tamsulosin (FLOMAX) 0.4 MG CAPS capsule Take 0.4 mg by mouth daily.   valACYclovir (VALTREX) 1000 MG tablet Take 1,000 mg by mouth as needed.     Allergies:   Patient has no known allergies.   Social History   Tobacco Use   Smoking status: Never   Smokeless tobacco: Never  Vaping Use   Vaping Use: Never used  Substance Use  Topics   Alcohol use: No   Drug use: No     Family Hx: The patient's family history is not on file.  ROS:   Please see the history of present illness.     All other systems reviewed and are negative.   Labs/Other Tests and Data Reviewed:    Recent Labs: 03/09/2022: ALT 24   Recent Lipid Panel Lab Results  Component Value Date/Time   CHOL 160 03/09/2022 11:57 AM   TRIG 182 (H) 03/09/2022 11:57 AM   HDL 59 03/09/2022 11:57 AM   CHOLHDL 2.7 03/09/2022 11:57 AM   LDLCALC 71 03/09/2022 11:57 AM    Wt Readings from Last 3 Encounters:  07/19/22 149 lb (67.6 kg)  12/08/21 157 lb 3.2 oz (71.3 kg)  12/13/20 151 lb 3.2 oz (68.6 kg)     Objective:    Vital Signs:  Ht 5' 7.5" (1.715 m)   Wt 149 lb (67.6 kg)   BMI 22.99 kg/m    Well nourished, well developed male in no acute distress. Well appearing, alert and conversant, regular work of breathing,  good skin color  Eyes- anicteric mouth- oral mucosa is pink  neuro- grossly intact skin- no apparent rash or lesions or cyanosis  ASSESSMENT & PLAN:    1.  OSA - The patient is tolerating PAP therapy well without any problems. The PAP download performed by his DME was personally reviewed and interpreted by me today and showed an AHI of 1.2/hr on auto CPAP with 40% compliance in using more than 4 hours nightly.  The patient has been using and benefiting from PAP use and will continue to benefit from therapy.  -he has been traveling a lot and was not taking it with him but recently has tried to use it more -I encouraged him to be more compliant with his device -He complains that the pressure is too high at times and will decrease auto CPAP to 4-15cm H2O and will get a download in 4 weeks -he had to switch DMEs as [facility] stopped doing CPAP and he was changed to [facility] and they will not give him any supplies without a MD order even though we already placed the order.  I will give them a prescription and send a note stating that he  benefits from OSA and should continue on it  2.  HTN -BP has been controlled at home and at MDs office -continue prescription drug management with Enresto 97-103mg  BID, Carvedilol 25mg  BID and spiro 25mg  daily with PRN refills   COVID-19 Education: The signs and symptoms of COVID-19 were discussed with the patient and how to seek care for testing (follow up with PCP  or arrange E-visit).  The importance of social distancing was discussed today.  Patient Risk:   After full review of this patient's clinical status, I feel that they are at least moderate risk at this time.  Time:   Today, I have spent 20 minutes on telemedicine discussing medical problems including OSA, HTN, and reviewing PAP compliance download from [facility].  Medication Adjustments/Labs and Tests Ordered: Current medicines are reviewed at length with the patient today.  Concerns regarding medicines are outlined above.  Tests Ordered: No orders of the defined types were placed in this encounter.  Medication Changes: No orders of the defined types were placed in this encounter.   Disposition:  Follow up in 1 year(s)  Signed, Armanda Magic, MD  07/19/2022 8:10 AM    [facility]

## 2022-07-19 NOTE — Patient Instructions (Signed)
Medication Instructions:  Your physician recommends that you continue on your current medications as directed. Please refer to the Current Medication list given to you today.  *If you need a refill on your cardiac medications before your next appointment, please call your pharmacy*   Lab Work: None If you have labs (blood work) drawn today and your tests are completely normal, you will receive your results only by: MyChart Message (if you have MyChart) OR A paper copy in the mail If you have any lab test that is abnormal or we need to change your treatment, we will call you to review the results.   Testing/Procedures: None  Follow-Up: At [facility], you and your health needs are our priority.  As part of our continuing mission to provide you with exceptional heart care, we have created designated Provider Care Teams.  These Care Teams include your primary Cardiologist (physician) and Advanced Practice Providers (APPs -  Physician Assistants and Nurse Practitioners) who all work together to provide you with the care you need, when you need it.  We recommend signing up for the patient portal called "MyChart".  Sign up information is provided on this After Visit Summary.  MyChart is used to connect with patients for Virtual Visits (Telemedicine).  Patients are able to view lab/test results, encounter notes, upcoming appointments, etc.  Non-urgent messages can be sent to your provider as well.   To learn more about what you can do with MyChart, go to [email or web address].    Your next appointment:   12 month(s)  The format for your next appointment:   In Person  Provider:   Armanda Magic, MD  Other Instructions We will have our sleep coordinator send [facility] a prescription for your CPAP and supplies and send a note to them as well stating that you benefit from the CPAP and should continue on it. We will make the following changes:  Decrease auto CPAP to 4-15cm H2O and we  will get a download in 4 weeks.   Important Information About Sugar

## 2022-07-19 NOTE — Telephone Encounter (Signed)
  Patient Consent for Virtual Visit        Marcus Vargas has provided verbal consent on 07/19/2022 for a virtual visit (video or telephone).   CONSENT FOR VIRTUAL VISIT FOR:  Marcus Vargas  By participating in this virtual visit I agree to the following:  I hereby voluntarily request, consent and authorize [facility] and its employed or contracted physicians, physician assistants, nurse practitioners or other licensed health care professionals (the Practitioner), to provide me with telemedicine health care services (the "Services") as deemed necessary by the treating Practitioner. I acknowledge and consent to receive the Services by the Practitioner via telemedicine. I understand that the telemedicine visit will involve communicating with the Practitioner through live audiovisual communication technology and the disclosure of certain medical information by electronic transmission. I acknowledge that I have been given the opportunity to request an in-person assessment or other available alternative prior to the telemedicine visit and am voluntarily participating in the telemedicine visit.  I understand that I have the right to withhold or withdraw my consent to the use of telemedicine in the course of my care at any time, without affecting my right to future care or treatment, and that the Practitioner or I may terminate the telemedicine visit at any time. I understand that I have the right to inspect all information obtained and/or recorded in the course of the telemedicine visit and may receive copies of available information for a reasonable fee.  I understand that some of the potential risks of receiving the Services via telemedicine include:  Delay or interruption in medical evaluation due to technological equipment failure or disruption; Information transmitted may not be sufficient (e.g. poor resolution of images) to allow for appropriate medical decision making by the Practitioner;  and/or  In rare instances, security protocols could fail, causing a breach of personal health information.  Furthermore, I acknowledge that it is my responsibility to provide information about my medical history, conditions and care that is complete and accurate to the best of my ability. I acknowledge that Practitioner's advice, recommendations, and/or decision may be based on factors not within their control, such as incomplete or inaccurate data provided by me or distortions of diagnostic images or specimens that may result from electronic transmissions. I understand that the practice of medicine is not an exact science and that Practitioner makes no warranties or guarantees regarding treatment outcomes. I acknowledge that a copy of this consent can be made available to me via my patient portal [facility] MyChart), or I can request a printed copy by calling the office of [facility].    I understand that my insurance will be billed for this visit.   I have read or had this consent read to me. I understand the contents of this consent, which adequately explains the benefits and risks of the Services being provided via telemedicine.  I have been provided ample opportunity to ask questions regarding this consent and the Services and have had my questions answered to my satisfaction. I give my informed consent for the services to be provided through the use of telemedicine in my medical care

## 2022-07-20 ENCOUNTER — Telehealth: Payer: Self-pay | Admitting: *Deleted

## 2022-07-20 NOTE — Telephone Encounter (Signed)
Order placed to [facility] to Decrease auto CPAP to 4-15cm H2O and will get a download in 4 weeks   Reached out to [facility] spoke to Sue Lush who states all the patient needs is updated office notes and that has been faxed over to [phone].

## 2022-07-20 NOTE — Telephone Encounter (Signed)
-----   Message from Lattie Haw, RN sent at 07/19/2022  8:33 AM EST ----- Regarding: Sleep Orders Per Dr. Mayford Knife:  Decrease auto CPAP to 4-15cm H2O and will get a download in 4 weeks. he had to switch DMEs as [facility] stopped doing CPAP and he was changed to [facility] and they will not give him any supplies without a MD order even though we already placed the order. Please give them a prescription and send a note stating that he benefits from OSA and should continue on it 1 year followup

## 2022-08-22 ENCOUNTER — Telehealth: Payer: Medicare Other | Admitting: Cardiology

## 2022-11-21 ENCOUNTER — Other Ambulatory Visit: Payer: Self-pay | Admitting: Cardiovascular Disease

## 2022-11-22 ENCOUNTER — Telehealth: Payer: Self-pay | Admitting: Cardiology

## 2022-11-22 DIAGNOSIS — G4733 Obstructive sleep apnea (adult) (pediatric): Secondary | ICD-10-CM

## 2022-11-22 NOTE — Telephone Encounter (Signed)
Patient called to ask doctor for a referral to an ENT doctor. Patient's previous ENT doctor closed. Would like for Dr. Radford Pax to make a recommendation for one.

## 2022-11-27 NOTE — Telephone Encounter (Signed)
Patient requesting over Mychart referral to new ENT as his previous ENT is no longer practicing. Reviewed Dr. Norris Cross recommendations for referral to Dr. Jenne Pane. Patient agrees to plan, order placed.

## 2022-12-09 ENCOUNTER — Other Ambulatory Visit: Payer: Self-pay | Admitting: Cardiovascular Disease

## 2022-12-10 NOTE — Telephone Encounter (Signed)
Rx request sent to pharmacy.  

## 2023-01-11 ENCOUNTER — Encounter: Payer: Self-pay | Admitting: Physician Assistant

## 2023-01-11 ENCOUNTER — Ambulatory Visit: Payer: Medicare Other | Attending: Physician Assistant | Admitting: Physician Assistant

## 2023-01-11 VITALS — BP 100/62 | HR 96 | Ht 68.0 in | Wt 154.8 lb

## 2023-01-11 DIAGNOSIS — Z79899 Other long term (current) drug therapy: Secondary | ICD-10-CM

## 2023-01-11 DIAGNOSIS — I5022 Chronic systolic (congestive) heart failure: Secondary | ICD-10-CM

## 2023-01-11 DIAGNOSIS — E782 Mixed hyperlipidemia: Secondary | ICD-10-CM

## 2023-01-11 DIAGNOSIS — R42 Dizziness and giddiness: Secondary | ICD-10-CM

## 2023-01-11 DIAGNOSIS — I4510 Unspecified right bundle-branch block: Secondary | ICD-10-CM

## 2023-01-11 DIAGNOSIS — I10 Essential (primary) hypertension: Secondary | ICD-10-CM

## 2023-01-11 MED ORDER — CARVEDILOL 25 MG PO TABS: 25.0000 mg | ORAL_TABLET | Freq: Two times a day (BID) | ORAL | 3 refills | Status: DC

## 2023-01-11 MED ORDER — ENTRESTO 97-103 MG PO TABS: 1.0000 | ORAL_TABLET | Freq: Two times a day (BID) | ORAL | 3 refills | Status: DC

## 2023-01-11 MED ORDER — SPIRONOLACTONE 25 MG PO TABS: 12.5000 mg | ORAL_TABLET | Freq: Every day | ORAL | 3 refills | Status: DC

## 2023-01-11 NOTE — Progress Notes (Unsigned)
Cardiology Office Note:    Date:  01/12/2023   ID:  Marcus Vargas, DOB March 04, 1956, MRN [id number]  PCP:  Daisy Floro, MD   [facility] Providers Cardiologist:  Nanetta Batty, MD     Referring MD: Daisy Floro, MD   Chief Complaint  Patient presents with   Follow-up    Seen for Dr. Allyson Sabal    History of Present Illness:    Marcus Vargas is a 67 y.o. male with a hx of chronic systolic heart failure, hyperlipidemia, DM2 and OSA.  Patient was initially referred to cardiology service for evaluation of elevated proBNP that was called on insurance physical blood work.  Subsequent 2D echo revealed severe LV dysfunction with EF 25%.  Myoview suggested ischemia in the RCA distribution.  He subsequently underwent left and right heart cath in January 2020 revealing moderate CAD involving proximal LAD with aneurysmal dilatation and the distal RCA, severe LV dysfunction.  He was placed on GDMT including carvedilol, Entresto and spironolactone.  Echocardiogram performed in April 2020 showed EF improved to 40 to 45%.  His most recent echocardiogram performed in August 2021 showed EF 52%.  Most recent echocardiogram obtained on 04/11/2021 showed EF 50 to 55%, trivial MR, mild AI.  He was seen by Dr. Gala Romney of advanced heart failure clinic who will help optimizing his heart failure medication.  He has since been released from advanced heart failure clinic after EF normalized.  He is being followed by Dr. Mayford Knife for obstructive sleep apnea.  Patient was last seen by Dr. Allyson Sabal in April 2023 at which time he was doing well.  His Crestor was increased to 40 mg daily.  Since the last visit, patient was seen by Dr. Mayford Knife for CPAP management in November 2023.  Patient presents today for follow-up.  He denies any recent exertional chest pain or shortness of breath.  He has no claudication symptom.  He does have leg cramps at night but does not occur during the day when he walks around.  In fact  walking helps with his leg cramp.  EKG today showed sinus rhythm with new right bundle branch block.  Given lack of anginal symptom, I will obtain a repeat echocardiogram.  He has been having some orthostatic dizziness when he get up too quickly or when he bends over and try to get up afterward.  I will reduce spironolactone down to 12.5 mg daily.  I would prefer him to stay on the Entresto and carvedilol.  He will need a CMP, fasting lipid panel, magnesium lab work in 1 to 2 weeks.  If both echocardiogram and the lab work looks fine, he may follow-up in 1 year.   Past Medical History:  Diagnosis Date   CHF (congestive heart failure) (HCC)    Diabetes mellitus without complication (HCC)    High cholesterol    OSA on CPAP     Past Surgical History:  Procedure Laterality Date   LEFT HEART CATH AND CORONARY ANGIOGRAPHY N/A 08/28/2018   Procedure: LEFT HEART CATH AND CORONARY ANGIOGRAPHY;  Surgeon: Runell Gess, MD;  Location: [facility];  Service: Cardiovascular;  Laterality: N/A;   SINUS SURGERY WITH INSTATRAK      Current Medications: Current Meds  Medication Sig   aspirin 81 MG chewable tablet Chew 81 mg by mouth daily.   Azelastine HCl 0.15 % SOLN Place 2 sprays into both nostrils daily.    CALCIUM CITRATE-VITAMIN D3 PO Take 1 tablet by  mouth daily.   CINNAMON PO Take 2 capsules by mouth daily.   glimepiride (AMARYL) 1 MG tablet Take 1 mg by mouth daily with breakfast. As needed   JARDIANCE 25 MG TABS tablet Take 25 mg by mouth daily.   levocetirizine (XYZAL) 5 MG tablet Take 5 mg by mouth every evening.    mometasone (NASONEX) 50 MCG/ACT nasal spray Place 2 sprays into the nose daily.    Multiple Vitamin (MULTIVITAMIN WITH MINERALS) TABS tablet Take 1 tablet by mouth daily.   Omega-3 Fatty Acids (EQL OMEGA 3 FISH OIL) 1400 MG CAPS Take 1,400 mg by mouth daily.   OZEMPIC, 0.25 OR 0.5 MG/DOSE, 2 MG/3ML SOPN Inject 0.5 mg into the skin once a week.   rosuvastatin (CRESTOR)  40 MG tablet Take 1 tablet (40 mg total) by mouth at bedtime.   tamsulosin (FLOMAX) 0.4 MG CAPS capsule Take 0.4 mg by mouth daily.   valACYclovir (VALTREX) 1000 MG tablet Take 1,000 mg by mouth as needed.   [DISCONTINUED] carvedilol (COREG) 25 MG tablet TAKE 1 TABLET TWICE A DAY. SCHEDULE OFFICE VISIT FOR FUTURE REFILLS.   [DISCONTINUED] ENTRESTO 97-103 MG TAKE 1 TABLET TWICE A DAY   [DISCONTINUED] spironolactone (ALDACTONE) 25 MG tablet TAKE 1 TABLET DAILY (CHANGEIN DOSAGE OR TABLET SIZE).     Allergies:   Patient has no known allergies.   Social History   Socioeconomic History   Marital status: Married    Spouse name: Not on file   Number of children: Not on file   Years of education: Not on file   Highest education level: Not on file  Occupational History   Not on file  Tobacco Use   Smoking status: Never   Smokeless tobacco: Never  Vaping Use   Vaping Use: Never used  Substance and Sexual Activity   Alcohol use: No   Drug use: No   Sexual activity: Not on file  Other Topics Concern   Not on file  Social History Narrative   Not on file   Social Determinants of Health   Financial Resource Strain: Not on file  Food Insecurity: Not on file  Transportation Needs: Not on file  Physical Activity: Not on file  Stress: Not on file  Social Connections: Not on file     Family History: The patient's family history is not on file.  ROS:   Please see the history of present illness.     All other systems reviewed and are negative.  EKGs/Labs/Other Studies Reviewed:    The following studies were reviewed today:  Echo 04/11/2021 1. Left ventricular ejection fraction, by estimation, is 50 to 55%. The  left ventricle has low normal function. The left ventricle has no regional  wall motion abnormalities. Left ventricular diastolic parameters were  normal.   2. Right ventricular systolic function is normal. The right ventricular  size is normal. There is normal pulmonary  artery systolic pressure.   3. The mitral valve is normal in structure. Trivial mitral valve  regurgitation. No evidence of mitral stenosis.   4. The aortic valve is normal in structure. There is mild calcification  of the aortic valve. Aortic valve regurgitation is mild. Mild aortic valve  sclerosis is present, with no evidence of aortic valve stenosis. Aortic  regurgitation PHT measures 566  msec.   5. The inferior vena cava is normal in size with greater than 50%  respiratory variability, suggesting right atrial pressure of 3 mmHg.   Comparison(s): No significant change  from prior study.   Conclusion(s)/Recommendation(s): Otherwise normal echocardiogram, with  minor abnormalities described in the report.   EKG:  EKG is ordered today.  The ekg ordered today demonstrates normal sinus rhythm, new right bundle branch block.  Recent Labs: 03/09/2022: ALT 24  Recent Lipid Panel    Component Value Date/Time   CHOL 160 03/09/2022 1157   TRIG 182 (H) 03/09/2022 1157   HDL 59 03/09/2022 1157   CHOLHDL 2.7 03/09/2022 1157   LDLCALC 71 03/09/2022 1157     Risk Assessment/Calculations:           Physical Exam:    VS:  BP 100/62 (BP Location: Left Arm, Patient Position: Sitting, Cuff Size: Normal)   Pulse 96   Ht 5\' 8"  (1.727 m)   Wt 154 lb 12.8 oz (70.2 kg)   SpO2 97%   BMI 23.54 kg/m         Wt Readings from Last 3 Encounters:  01/11/23 154 lb 12.8 oz (70.2 kg)  07/19/22 149 lb (67.6 kg)  12/08/21 157 lb 3.2 oz (71.3 kg)     GEN:  Well nourished, well developed in no acute distress HEENT: Normal NECK: No JVD; No carotid bruits LYMPHATICS: No lymphadenopathy CARDIAC: RRR, no murmurs, rubs, gallops RESPIRATORY:  Clear to auscultation without rales, wheezing or rhonchi  ABDOMEN: Soft, non-tender, non-distended MUSCULOSKELETAL:  No edema; No deformity  SKIN: Warm and dry NEUROLOGIC:  Alert and oriented x 3 PSYCHIATRIC:  Normal affect   ASSESSMENT:    1. Dizziness    2. Essential hypertension   3. Medication management   4. Right bundle branch block   5. Chronic systolic heart failure (HCC)   6. Mixed hyperlipidemia    PLAN:    In order of problems listed above:  Dizziness: Symptom consistent with orthostatic dizziness.  Will reduce spironolactone to 12.5 mg daily  Hypertension: Blood pressure borderline, given orthostatic dizziness, will reduce spironolactone to 12.5 mg daily  Medication management: Obtain CMP, fasting lipid panel and magnesium  New right bundle branch block: Obtain echocardiogram  History of chronic systolic heart failure: with improved EF.  EF 50 to 55% in August 2022.  Hyperlipidemia: On on Crestor.           Medication Adjustments/Labs and Tests Ordered: Current medicines are reviewed at length with the patient today.  Concerns regarding medicines are outlined above.  Orders Placed This Encounter  Procedures   Comprehensive metabolic panel   Lipid panel   Magnesium   EKG 12-Lead   ECHOCARDIOGRAM COMPLETE   Meds ordered this encounter  Medications   spironolactone (ALDACTONE) 25 MG tablet    Sig: Take 0.5 tablets (12.5 mg total) by mouth daily.    Dispense:  45 tablet    Refill:  3   carvedilol (COREG) 25 MG tablet    Sig: Take 1 tablet (25 mg total) by mouth 2 (two) times daily with a meal.    Dispense:  180 tablet    Refill:  3   sacubitril-valsartan (ENTRESTO) 97-103 MG    Sig: Take 1 tablet by mouth 2 (two) times daily.    Dispense:  180 tablet    Refill:  3    Patient Instructions  Medication Instructions:  Your physician has recommended you make the following change in your medication:  DECREASE: Spirolactone 12.5 mg (half a tablet) once daily  *If you need a refill on your cardiac medications before your next appointment, please call your pharmacy*   Lab  Work: Your physician recommends that you return for lab work in 1-2 weeks: CMP, Lipids, Mag - please come fasting If you have labs  (blood work) drawn today and your tests are completely normal, you will receive your results only by: MyChart Message (if you have MyChart) OR A paper copy in the mail If you have any lab test that is abnormal or we need to change your treatment, we will call you to review the results.   Testing/Procedures: Your physician has requested that you have an echocardiogram. Echocardiography is a painless test that uses sound waves to create images of your heart. It provides your doctor with information about the size and shape of your heart and how well your heart's chambers and valves are working. This procedure takes approximately one hour. There are no restrictions for this procedure. Please do NOT wear cologne, perfume, aftershave, or lotions (deodorant is allowed). Please arrive 15 minutes prior to your appointment time.   Follow-Up: At [facility], you and your health needs are our priority.  As part of our continuing mission to provide you with exceptional heart care, we have created designated Provider Care Teams.  These Care Teams include your primary Cardiologist (physician) and Advanced Practice Providers (APPs -  Physician Assistants and Nurse Practitioners) who all work together to provide you with the care you need, when you need it.   Your next appointment:   1 year(s)  Provider:   Nanetta Batty, MD    Signed, Azalee Course, [place]  01/12/2023 6:11 PM    [facility]

## 2023-01-11 NOTE — Patient Instructions (Addendum)
Medication Instructions:  Your physician has recommended you make the following change in your medication:  DECREASE: Spirolactone 12.5 mg (half a tablet) once daily  *If you need a refill on your cardiac medications before your next appointment, please call your pharmacy*   Lab Work: Your physician recommends that you return for lab work in 1-2 weeks: CMP, Lipids, Mag - please come fasting If you have labs (blood work) drawn today and your tests are completely normal, you will receive your results only by: MyChart Message (if you have MyChart) OR A paper copy in the mail If you have any lab test that is abnormal or we need to change your treatment, we will call you to review the results.   Testing/Procedures: Your physician has requested that you have an echocardiogram. Echocardiography is a painless test that uses sound waves to create images of your heart. It provides your doctor with information about the size and shape of your heart and how well your heart's chambers and valves are working. This procedure takes approximately one hour. There are no restrictions for this procedure. Please do NOT wear cologne, perfume, aftershave, or lotions (deodorant is allowed). Please arrive 15 minutes prior to your appointment time.   Follow-Up: At [facility], you and your health needs are our priority.  As part of our continuing mission to provide you with exceptional heart care, we have created designated Provider Care Teams.  These Care Teams include your primary Cardiologist (physician) and Advanced Practice Providers (APPs -  Physician Assistants and Nurse Practitioners) who all work together to provide you with the care you need, when you need it.   Your next appointment:   1 year(s)  Provider:   Nanetta Batty, MD

## 2023-01-12 ENCOUNTER — Encounter: Payer: Self-pay | Admitting: Physician Assistant

## 2023-01-26 LAB — LIPID PANEL
Chol/HDL Ratio: 2.2 ratio (ref 0.0–5.0)
Cholesterol, Total: 146 mg/dL (ref 100–199)
HDL: 66 mg/dL (ref >39)
LDL Chol Calc (NIH): 58 mg/dL (ref 0–99)
Triglycerides: 130 mg/dL (ref 0–149)
VLDL Cholesterol Cal: 22 mg/dL (ref 5–40)

## 2023-01-26 LAB — COMPREHENSIVE METABOLIC PANEL
ALT: 45 IU/L — ABNORMAL HIGH (ref 0–44)
AST: 32 IU/L (ref 0–40)
Albumin/Globulin Ratio: 2.4 — ABNORMAL HIGH (ref 1.2–2.2)
Albumin: 4.6 g/dL (ref 3.9–4.9)
Alkaline Phosphatase: 74 IU/L (ref 44–121)
BUN/Creatinine Ratio: 21 (ref 10–24)
BUN: 15 mg/dL (ref 8–27)
Bilirubin Total: 0.4 mg/dL (ref 0.0–1.2)
CO2: 24 mmol/L (ref 20–29)
Calcium: 9.6 mg/dL (ref 8.6–10.2)
Chloride: 103 mmol/L (ref 96–106)
Creatinine, Ser: 0.7 mg/dL — ABNORMAL LOW (ref 0.76–1.27)
Globulin, Total: 1.9 g/dL (ref 1.5–4.5)
Glucose: 89 mg/dL (ref 70–99)
Potassium: 4.4 mmol/L (ref 3.5–5.2)
Sodium: 141 mmol/L (ref 134–144)
Total Protein: 6.5 g/dL (ref 6.0–8.5)
eGFR: 101 mL/min/{1.73_m2} (ref >59)

## 2023-01-26 LAB — MAGNESIUM: Magnesium: 2.2 mg/dL (ref 1.6–2.3)

## 2023-02-13 ENCOUNTER — Ambulatory Visit (HOSPITAL_BASED_OUTPATIENT_CLINIC_OR_DEPARTMENT_OTHER): Payer: Medicare Other | Attending: Physician Assistant

## 2023-02-14 ENCOUNTER — Encounter (HOSPITAL_COMMUNITY): Payer: Self-pay | Admitting: Physician Assistant

## 2023-03-12 ENCOUNTER — Ambulatory Visit (HOSPITAL_BASED_OUTPATIENT_CLINIC_OR_DEPARTMENT_OTHER)
Admission: RE | Admit: 2023-03-12 | Discharge: 2023-03-12 | Disposition: A | Discharge status: Home / Self Care | Payer: Medicare Other | Source: Ambulatory Visit | Attending: Physician Assistant | Admitting: Physician Assistant

## 2023-03-12 DIAGNOSIS — I4510 Unspecified right bundle-branch block: Secondary | ICD-10-CM | POA: Diagnosis not present

## 2023-03-12 LAB — ECHOCARDIOGRAM COMPLETE
Area-P 1/2: 7.66 cm2
P 1/2 time: 466 msec
S' Lateral: 3.1 cm

## 2023-09-20 ENCOUNTER — Telehealth: Payer: Self-pay | Admitting: Cardiovascular Disease

## 2023-09-20 NOTE — Telephone Encounter (Signed)
*  STAT* If patient is at the pharmacy, call can be transferred to refill team.   1. Which medications need to be refilled? (please list name of each medication and dose if known) carvedilol (COREG) 25 MG tablet   sacubitril-valsartan (ENTRESTO) 97-103 MG    spironolactone (ALDACTONE) 25 MG tablet    2. Which pharmacy/location (including street and city if local pharmacy) is medication to be sent to? [facility] - [place], [place] - [address]   3. Do they need a 30 day or 90 day supply? 90

## 2023-09-23 MED ORDER — ENTRESTO 97-103 MG PO TABS: 1.0000 | ORAL_TABLET | Freq: Two times a day (BID) | ORAL | 1 refills | Status: DC

## 2023-09-23 MED ORDER — SPIRONOLACTONE 25 MG PO TABS: 12.5000 mg | ORAL_TABLET | Freq: Every day | ORAL | 1 refills | Status: DC

## 2023-09-23 MED ORDER — CARVEDILOL 25 MG PO TABS: 25.0000 mg | ORAL_TABLET | Freq: Two times a day (BID) | ORAL | 1 refills | Status: DC

## 2024-01-29 ENCOUNTER — Encounter: Payer: Self-pay | Admitting: Cardiovascular Disease

## 2024-01-29 ENCOUNTER — Ambulatory Visit: Attending: Cardiovascular Disease | Admitting: Cardiovascular Disease

## 2024-01-29 VITALS — BP 102/62 | HR 96 | Ht 68.0 in

## 2024-01-29 DIAGNOSIS — I2510 Atherosclerotic heart disease of native coronary artery without angina pectoris: Secondary | ICD-10-CM | POA: Diagnosis present

## 2024-01-29 DIAGNOSIS — I519 Heart disease, unspecified: Secondary | ICD-10-CM | POA: Diagnosis present

## 2024-01-29 DIAGNOSIS — I4510 Unspecified right bundle-branch block: Secondary | ICD-10-CM | POA: Insufficient documentation

## 2024-01-29 DIAGNOSIS — E782 Mixed hyperlipidemia: Secondary | ICD-10-CM | POA: Diagnosis present

## 2024-01-29 DIAGNOSIS — I5022 Chronic systolic (congestive) heart failure: Secondary | ICD-10-CM | POA: Insufficient documentation

## 2024-01-29 NOTE — Progress Notes (Signed)
 01/29/2024 Marcus Vargas   12/22/1955  [id number]  Primary Physician Marcus Mould, MD Primary Cardiologist: Marcus Leigh MD Marcus Vargas, [place]  HPI:  Marcus Vargas is a 68 y.o.   thin appearing married Caucasian male father of 4 children, grandfather one grandchild referred by Dr. Avanell Vargas for cardiovascular evaluation because of elevated proBNP.  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  I last saw him in the office 12/08/21..  Cardiac risk factors are notable for true hyperlipidemia and diabetes.  There is no family history of heart disease.  Never had a heart attack or stroke.  He denies chest pain or shortness of breath.  He does get diaphoretic at times when he is exercising.  Apparently for insurance physical blood work revealed an elevated proBNP and he was referred here for further evaluation.  A 2D echocardiogram revealed severe LV dysfunction with an EF in the 25% range and a Myoview  stress test suggested ischemia in the RCA distribution.  Based on this, he underwent right and left heart cath by myself 08/28/2018 revealing moderate CAD (proximal LAD with aneurysmal dilatation, distal RCA) and severe LV dysfunction.  He was ultimately placed on heart failure pharmacology including carvedilol  which was uptitrated, spironolactone  and Entresto .   A recent 2D echo performed 12/11/2018 showed improvement in his EF up to 40 to 45%.  His most recent echo performed 03/24/2020 revealed EF of 52%.  He has seen Dr. Bensimhon in the advanced heart failure clinic who has assisted in optimizing his heart failure medications.  He is currently at most class I to class II and is relatively asymptomatic.  He is also seen Dr. Micael Vargas for evaluation of obstructive sleep apnea.   He has been released from Dr. Andria Vargas advanced heart failure practice because of clinical improvement.  His EF is in the low 50% range, low normal as result of guideline directed  optimal medical therapy.   Since I saw him in the office 2 years ago he continues to do well.  He is on GDMT.  His most recent 2D echocardiogram performed 03/12/2023 revealed normal LV systolic function without valvular abnormalities.  He is completely asymptomatic.   Current Meds  Medication Sig   aspirin  81 MG chewable tablet Chew 81 mg by mouth daily.   Azelastine HCl 0.15 % SOLN Place 2 sprays into both nostrils daily.    CALCIUM  CITRATE-VITAMIN D3 PO Take 1 tablet by mouth daily.   carvedilol  (COREG ) 25 MG tablet Take 1 tablet (25 mg total) by mouth 2 (two) times daily with a meal.   CINNAMON PO Take 2 capsules by mouth daily.   glimepiride (AMARYL) 1 MG tablet Take 1 mg by mouth daily with breakfast. As needed   JARDIANCE 25 MG TABS tablet Take 25 mg by mouth daily.   levocetirizine (XYZAL) 5 MG tablet Take 5 mg by mouth every evening.    mometasone (NASONEX) 50 MCG/ACT nasal spray Place 2 sprays into the nose daily.    Multiple Vitamin (MULTIVITAMIN WITH MINERALS) TABS tablet Take 1 tablet by mouth daily.   Omega-3 Fatty Acids (EQL OMEGA 3 FISH OIL) 1400 MG CAPS Take 1,400 mg by mouth daily.   OZEMPIC, 0.25 OR 0.5 MG/DOSE, 2 MG/3ML SOPN Inject 0.5 mg into the skin once a week.   rosuvastatin  (CRESTOR ) 40 MG tablet Take 1 tablet (40 mg total) by mouth at bedtime.   sacubitril -valsartan  (ENTRESTO ) 97-103 MG  Take 1 tablet by mouth 2 (two) times daily.   spironolactone  (ALDACTONE ) 25 MG tablet Take 0.5 tablets (12.5 mg total) by mouth daily.   tamsulosin (FLOMAX) 0.4 MG CAPS capsule Take 0.4 mg by mouth daily.   valACYclovir (VALTREX) 1000 MG tablet Take 1,000 mg by mouth as needed.     No Known Allergies  Social History   Socioeconomic History   Marital status: Married    Spouse name: Not on file   Number of children: Not on file   Years of education: Not on file   Highest education level: Not on file  Occupational History   Not on file  Tobacco Use   Smoking status: Never    Smokeless tobacco: Never  Vaping Use   Vaping status: Never Used  Substance and Sexual Activity   Alcohol use: No   Drug use: No   Sexual activity: Not on file  Other Topics Concern   Not on file  Social History Narrative   Not on file   Social Drivers of Health   Financial Resource Strain: Not on file  Food Insecurity: Not on file  Transportation Needs: Not on file  Physical Activity: Not on file  Stress: Not on file  Social Connections: Unknown (08/19/2022)   Received from [facility]   Social Network    Social Network: Not on file  Intimate Partner Violence: Unknown (08/19/2022)   Received from [facility], [facility]   HITS    Physically Hurt: Not on file    Insult or Talk Down To: Not on file    Threaten Physical Harm: Not on file    Scream or Curse: Not on file     Review of Systems: General: negative for chills, fever, night sweats or weight changes.  Cardiovascular: negative for chest pain, dyspnea on exertion, edema, orthopnea, palpitations, paroxysmal nocturnal dyspnea or shortness of breath Dermatological: negative for rash Respiratory: negative for cough or wheezing Urologic: negative for hematuria Abdominal: negative for nausea, vomiting, diarrhea, bright red blood per rectum, melena, or hematemesis Neurologic: negative for visual changes, syncope, or dizziness All other systems reviewed and are otherwise negative except as noted above.    Blood pressure 102/62, pulse 96, height 5' 8 (1.727 m), SpO2 96%.  General appearance: alert and no distress Neck: no adenopathy, no carotid bruit, no JVD, supple, symmetrical, trachea midline, and thyroid not enlarged, symmetric, no tenderness/mass/nodules Lungs: clear to auscultation bilaterally Heart: regular rate and rhythm, S1, S2 normal, no murmur, click, rub or gallop Extremities: extremities normal, atraumatic, no cyanosis or edema Pulses: 2+ and symmetric Skin: Skin color, texture,  turgor normal. No rashes or lesions Neurologic: Grossly normal  EKG EKG Interpretation Date/Time:  Wednesday January 29 2024 14:43:17 EDT Ventricular Rate:  96 PR Interval:  170 QRS Duration:  96 QT Interval:  370 QTC Calculation: 467 R Axis:   91  Text Interpretation: Normal sinus rhythm Rightward axis When compared with ECG of 28-Aug-2018 11:31, T wave inversion no longer evident in Lateral leads Confirmed by Lauro Portal [phone]) on 01/29/2024 3:20:48 PM    ASSESSMENT AND PLAN:   Hyperlipidemia History of hyperlipidemia on high-dose statin therapy with lipid profile performed 05/10/2023 revealing a total cholesterol 141, LDL 60 HDL 56, acceptable for secondary prevention.  Left ventricular dysfunction History of nonischemic cardiomyopathy on GDMT with complete normalization of his EF by 2D echo 03/11/2023.  He does have grade 1 diastolic dysfunction.  He is completely asymptomatic.  Coronary artery  disease Mild nonobstructive CAD by cath which I performed 08/28/2018 with proximal LAD aneurysmal dilatation and distal RCA disease.  It was elected to treat him medically.     Marcus Leigh MD FACP,FACC,FAHA, [facility] 01/29/2024 3:31 PM

## 2024-01-29 NOTE — Assessment & Plan Note (Signed)
 Mild nonobstructive CAD by cath which I performed 08/28/2018 with proximal LAD aneurysmal dilatation and distal RCA disease.  It was elected to treat him medically.

## 2024-01-29 NOTE — Assessment & Plan Note (Signed)
 History of nonischemic cardiomyopathy on GDMT with complete normalization of his EF by 2D echo 03/11/2023.  He does have grade 1 diastolic dysfunction.  He is completely asymptomatic.

## 2024-01-29 NOTE — Patient Instructions (Signed)

## 2024-01-29 NOTE — Assessment & Plan Note (Signed)
 History of hyperlipidemia on high-dose statin therapy with lipid profile performed 05/10/2023 revealing a total cholesterol 141, LDL 60 HDL 56, acceptable for secondary prevention.

## 2024-03-07 ENCOUNTER — Other Ambulatory Visit: Payer: Self-pay | Admitting: Cardiovascular Disease

## 2024-03-09 ENCOUNTER — Other Ambulatory Visit: Payer: Self-pay | Admitting: Cardiovascular Disease

## 2025-02-01 ENCOUNTER — Ambulatory Visit: Admitting: Cardiovascular Disease
